# Patient Record
Sex: Female | Born: 1970 | Hispanic: No | Marital: Single | Smoking: Never smoker
Health system: Southern US, Community
[De-identification: ages and names within clinical notes are randomized; demographics above are authoritative.]

## PROBLEM LIST (undated history)

## (undated) DIAGNOSIS — F411 Generalized anxiety disorder: Secondary | ICD-10-CM

## (undated) DIAGNOSIS — D509 Iron deficiency anemia, unspecified: Secondary | ICD-10-CM

## (undated) DIAGNOSIS — F32A Depression, unspecified: Secondary | ICD-10-CM

## (undated) DIAGNOSIS — I10 Essential (primary) hypertension: Secondary | ICD-10-CM

## (undated) HISTORY — DX: Depression, unspecified: F32.A

## (undated) HISTORY — DX: Generalized anxiety disorder: F41.1

## (undated) HISTORY — DX: Iron deficiency anemia, unspecified: D50.9

---

## 2016-08-06 ENCOUNTER — Emergency Department (HOSPITAL_BASED_OUTPATIENT_CLINIC_OR_DEPARTMENT_OTHER)
Admission: EM | Admit: 2016-08-06 | Discharge: 2016-08-06 | Disposition: A | Discharge status: Home / Self Care | Payer: BLUE CROSS/BLUE SHIELD | Attending: Emergency Medicine | Admitting: Emergency Medicine

## 2016-08-06 ENCOUNTER — Encounter (HOSPITAL_BASED_OUTPATIENT_CLINIC_OR_DEPARTMENT_OTHER): Payer: Self-pay | Admitting: *Deleted

## 2016-08-06 ENCOUNTER — Emergency Department (HOSPITAL_BASED_OUTPATIENT_CLINIC_OR_DEPARTMENT_OTHER): Payer: BLUE CROSS/BLUE SHIELD

## 2016-08-06 DIAGNOSIS — Z9114 Patient's other noncompliance with medication regimen: Secondary | ICD-10-CM | POA: Diagnosis not present

## 2016-08-06 DIAGNOSIS — R6889 Other general symptoms and signs: Secondary | ICD-10-CM

## 2016-08-06 DIAGNOSIS — R0789 Other chest pain: Secondary | ICD-10-CM | POA: Insufficient documentation

## 2016-08-06 DIAGNOSIS — R51 Headache: Secondary | ICD-10-CM | POA: Diagnosis not present

## 2016-08-06 DIAGNOSIS — R05 Cough: Secondary | ICD-10-CM | POA: Insufficient documentation

## 2016-08-06 DIAGNOSIS — I10 Essential (primary) hypertension: Secondary | ICD-10-CM | POA: Diagnosis not present

## 2016-08-06 DIAGNOSIS — R0981 Nasal congestion: Secondary | ICD-10-CM | POA: Diagnosis not present

## 2016-08-06 DIAGNOSIS — R1111 Vomiting without nausea: Secondary | ICD-10-CM | POA: Diagnosis present

## 2016-08-06 HISTORY — DX: Essential (primary) hypertension: I10

## 2016-08-06 MED ORDER — KETOROLAC TROMETHAMINE 30 MG/ML IJ SOLN
60.0000 mg | Freq: Once | INTRAMUSCULAR | Status: AC
  Administered 2016-08-06: 60 mg via INTRAMUSCULAR
  Filled 2016-08-06: qty 2

## 2016-08-06 MED ORDER — HYDROCHLOROTHIAZIDE 12.5 MG PO TABS: 12.5000 mg | ORAL_TABLET | Freq: Every day | ORAL | 2 refills | Status: DC

## 2016-08-06 MED ORDER — ONDANSETRON 4 MG PO TBDP: 4.0000 mg | ORAL_TABLET | Freq: Four times a day (QID) | ORAL | 0 refills | Status: DC | PRN

## 2016-08-06 MED ORDER — ONDANSETRON 8 MG PO TBDP
8.0000 mg | ORAL_TABLET | Freq: Once | ORAL | Status: AC
  Administered 2016-08-06: 8 mg via ORAL
  Filled 2016-08-06: qty 1

## 2016-08-06 MED ORDER — LOSARTAN POTASSIUM 50 MG PO TABS: 50.0000 mg | ORAL_TABLET | Freq: Every day | ORAL | 2 refills | Status: DC

## 2016-08-06 MED ORDER — HYDROCHLOROTHIAZIDE 25 MG PO TABS
25.0000 mg | ORAL_TABLET | Freq: Once | ORAL | Status: AC
  Administered 2016-08-06: 25 mg via ORAL
  Filled 2016-08-06: qty 1

## 2016-08-06 MED ORDER — HYDROCHLOROTHIAZIDE 25 MG PO TABS
12.5000 mg | ORAL_TABLET | Freq: Once | ORAL | Status: DC
  Filled 2016-08-06: qty 1

## 2016-08-06 MED ORDER — LOSARTAN POTASSIUM 50 MG PO TABS
50.0000 mg | ORAL_TABLET | Freq: Once | ORAL | Status: DC
  Filled 2016-08-06: qty 1

## 2016-08-06 NOTE — Discharge Instructions (Addendum)
You may alternate Tylenol 1000 mg every 6 hours as needed for fever and pain and ibuprofen 800 mg every 8 hours as needed for fever and pain. Please rest and drink plenty of fluids. This is a viral illness causing your symptoms. You do not need antibiotics for a virus. You may use over-the-counter nasal saline spray and Afrin nasal saline spray as needed for nasal congestion. Please do not use Afrin for more than 3 days in a row. You may use Mucinex as needed for cough.  This may take 7-14 days to run its course.  We do not test for the flu from the emergency department as we do not have rapid flu swabs and it takes hours for this test to come back and it would not change our management. The flu is treated like any other virus with supportive measures as listed above.   Please start taking your blood pressure medication every day. I recommend you establish care with a primary care physician.    To find a primary care or specialty doctor please call [phone] or [phone] to access "[facility] Find a Doctor Service."  You may also go on the [facility] website at [email or web address]  There are also multiple [facility], [facility], [facility] and [facility] practices throughout the [place] that are frequently accepting new patients. You may find a clinic that is close to your home and contact them.  [facility] -  [address] [place] [phone]   [facility] -  [address] [place] [id number] [phone]   [facility] [phone]  [place] [phone]

## 2016-08-06 NOTE — ED Provider Notes (Signed)
TIME SEEN: 5:04 AM  CHIEF COMPLAINT: Flulike symptoms  HPI: Patient is a 46 year old female with history of hypertension not currently on medications who presents emergency department with several days of symptoms of gradual onset, mild, diffuse throbbing headache, cough with sputum production, nasal congestion, shortness of breath, chest soreness with coughing that is worse with palpation of her chest, body aches, nausea and vomiting. No diarrhea. Children with recent similar symptoms. No rash. No travel outside the country. States she is only taking Mucinex for her symptoms. No Tylenol or ibuprofen.  ROS: See HPI Constitutional: no fever  Eyes: no drainage  ENT: no runny nose   Cardiovascular:   chest pain  Resp: SOB  GI: vomiting GU: no dysuria Integumentary: no rash  Allergy: no hives  Musculoskeletal: no leg swelling  Neurological: no slurred speech ROS otherwise negative  PAST MEDICAL HISTORY/PAST SURGICAL HISTORY:  Past Medical History:  Diagnosis Date  . Hypertension     MEDICATIONS:  Prior to Admission medications   Not on File    ALLERGIES:  No Known Allergies  SOCIAL HISTORY:  Social History  Substance Use Topics  . Smoking status: Not on file  . Smokeless tobacco: Not on file  . Alcohol use Not on file    FAMILY HISTORY: No family history on file.  EXAM: BP (!) 146/129 (BP Location: Right Arm)   Pulse 99   Temp 98.9 F (37.2 C) (Oral)   Resp 18   Ht 5\' 6"  (1.676 m)   Wt 98.5 kg (217 lb 3.2 oz)   LMP 07/17/2016 (Exact Date)   SpO2 100%   BMI 35.06 kg/m  CONSTITUTIONAL: Alert and oriented and responds appropriately to questions. Well-appearing; well-nourished, Afebrile, nontoxic, obese HEAD: Normocephalic EYES: Conjunctivae clear, pupils appear equal, EOMI ENT: normal nose; moist mucous membranes; No pharyngeal erythema or petechiae, no tonsillar hypertrophy or exudate, no uvular deviation, no unilateral swelling, no trismus or drooling, no  muffled voice, normal phonation, no stridor, no dental caries present, no drainable dental abscess noted, no Ludwig's angina, tongue sits flat in the bottom of the mouth, no angioedema, no facial erythema or warmth, no facial swelling; no pain with movement of the neck. NECK: Supple, no meningismus, no nuchal rigidity, no LAD  CARD: RRR; S1 and S2 appreciated; no murmurs, no clicks, no rubs, no gallops CHEST:  Chest wall is tender to palpation diffusely throughout the anterior chest.  No crepitus, ecchymosis, erythema, warmth, rash or other lesions present.   RESP: Normal chest excursion without splinting or tachypnea; breath sounds clear and equal bilaterally; no wheezes, no rhonchi, no rales, no hypoxia or respiratory distress, speaking full sentences ABD/GI: Normal bowel sounds; non-distended; soft, non-tender, no rebound, no guarding, no peritoneal signs, no hepatosplenomegaly BACK:  The back appears normal and is non-tender to palpation, there is no CVA tenderness EXT: Normal ROM in all joints; non-tender to palpation; no edema; normal capillary refill; no cyanosis, no calf tenderness or swelling    SKIN: Normal color for age and race; warm; no rash NEURO: Moves all extremities equally, normal gait, normal speech, cranial nerves II through XII intact, sensation to light touch intact diffusely PSYCH: The patient's mood and manner are appropriate. Grooming and personal hygiene are appropriate.  MEDICAL DECISION MAKING: Patient here with flulike symptoms. She is afebrile here and has not any antipyretics. Complains of chest soreness with coughing and feeling short of breath but her lungs are clear. She is hypertensive and reports this is her baseline. States  she is not on medications for her blood pressure. Will obtain EKG and chest x-ray but I have low suspicion that this is ACS, dissection or PE. No signs of meningitis. No signs of pharyngitis. Abdominal exam is benign. She does not appear  dehydrated. Will treat symptomatically with IM Toradol, Zofran and reassess.  It appears patient is supposed to be on losartan 50 mg and HCTZ 12.5 mg but states she has not been taking this in several months. We'll give her a dose of her home medications and refills for the same. I have had lengthy discussion with her that she needs to be compliant with this medication as having high blood pressure can be very dangerous. I do not think that this is the cause of her current headache, chest pain. She has no focal neurologic deficits.  ED PROGRESS: 5:30 AM  Pt's blood pressure is improved to 139/87 before any medications have been given. This makes me suspect that her headache and chest pain are definitely not caused by her blood pressure.  EKG is reassuring.   6:05 AM  Pt's blood pressure is currently 126/73 with a heart rate in the 70s. She reports feeling better. She's been able to drink without vomiting. I feel she is safe to be discharged home. Her chest x-ray is clear. I do not feel she needs antibiotics at this time. Suspect viral illness. I have refilled her blood pressure medication. Will also give list of primary care physicians. Discussed supportive care instructions and return precautions.   At this time, I do not feel there is any life-threatening condition present. I have reviewed and discussed all results (EKG, imaging, lab, urine as appropriate) and exam findings with patient/family. I have reviewed nursing notes and appropriate previous records.  I feel the patient is safe to be discharged home without further emergent workup and can continue workup as an outpatient as needed. Discussed usual and customary return precautions. Patient/family verbalize understanding and are comfortable with this plan.  Outpatient follow-up has been provided if needed. All questions have been answered.     EKG Interpretation  Date/Time:  Wednesday Aug 06 2016 05:14:54 EDT Ventricular Rate:  91 PR  Interval:    QRS Duration: 81 QT Interval:  362 QTC Calculation: 446 R Axis:   18 Text Interpretation:  Sinus rhythm Borderline T wave abnormalities No old tracing to compare Confirmed by Ward, Baxter HireKristen [phone]([id number]) on 08/06/2016 5:20:28 AM          Ward, Layla MawKristen N, DO 08/06/16 [phone]

## 2016-08-06 NOTE — ED Triage Notes (Signed)
C/o ha, congestion, cough, light headed, sob generalized body aches and nausea and  Vomiting onset yesterday

## 2016-10-27 HISTORY — PX: BREAST BIOPSY: SHX20

## 2017-01-28 ENCOUNTER — Emergency Department (HOSPITAL_BASED_OUTPATIENT_CLINIC_OR_DEPARTMENT_OTHER): Payer: BLUE CROSS/BLUE SHIELD

## 2017-01-28 ENCOUNTER — Emergency Department (HOSPITAL_BASED_OUTPATIENT_CLINIC_OR_DEPARTMENT_OTHER)
Admission: EM | Admit: 2017-01-28 | Discharge: 2017-01-28 | Disposition: A | Discharge status: Home / Self Care | Payer: BLUE CROSS/BLUE SHIELD | Attending: Emergency Medicine | Admitting: Emergency Medicine

## 2017-01-28 ENCOUNTER — Other Ambulatory Visit: Payer: Self-pay

## 2017-01-28 ENCOUNTER — Encounter (HOSPITAL_BASED_OUTPATIENT_CLINIC_OR_DEPARTMENT_OTHER): Payer: Self-pay

## 2017-01-28 DIAGNOSIS — Z79899 Other long term (current) drug therapy: Secondary | ICD-10-CM | POA: Diagnosis not present

## 2017-01-28 DIAGNOSIS — I10 Essential (primary) hypertension: Secondary | ICD-10-CM | POA: Diagnosis not present

## 2017-01-28 DIAGNOSIS — G43109 Migraine with aura, not intractable, without status migrainosus: Secondary | ICD-10-CM | POA: Diagnosis not present

## 2017-01-28 DIAGNOSIS — R51 Headache: Secondary | ICD-10-CM | POA: Diagnosis present

## 2017-01-28 LAB — COMPREHENSIVE METABOLIC PANEL
ALBUMIN: 4 g/dL (ref 3.5–5.0)
ALT: 19 U/L (ref 14–54)
AST: 21 U/L (ref 15–41)
Alkaline Phosphatase: 65 U/L (ref 38–126)
Anion gap: 6 (ref 5–15)
BILIRUBIN TOTAL: 0.4 mg/dL (ref 0.3–1.2)
BUN: 16 mg/dL (ref 6–20)
CALCIUM: 9.3 mg/dL (ref 8.9–10.3)
CHLORIDE: 104 mmol/L (ref 101–111)
CO2: 28 mmol/L (ref 22–32)
Creatinine, Ser: 1.02 mg/dL — ABNORMAL HIGH (ref 0.44–1.00)
GFR calc Af Amer: >60 mL/min (ref >60)
GFR calc non Af Amer: >60 mL/min (ref >60)
GLUCOSE: 96 mg/dL (ref 65–99)
POTASSIUM: 3.6 mmol/L (ref 3.5–5.1)
SODIUM: 138 mmol/L (ref 135–145)
TOTAL PROTEIN: 7.2 g/dL (ref 6.5–8.1)

## 2017-01-28 LAB — RAPID URINE DRUG SCREEN, HOSP PERFORMED
Amphetamines: NOT DETECTED
Barbiturates: NOT DETECTED
Benzodiazepines: NOT DETECTED
COCAINE: NOT DETECTED
OPIATES: NOT DETECTED
TETRAHYDROCANNABINOL: NOT DETECTED

## 2017-01-28 LAB — CBC
HCT: 39.1 % (ref 36.0–46.0)
HEMOGLOBIN: 12.7 g/dL (ref 12.0–15.0)
MCH: 26 pg (ref 26.0–34.0)
MCHC: 32.5 g/dL (ref 30.0–36.0)
MCV: 80.1 fL (ref 78.0–100.0)
Platelets: 418 10*3/uL — ABNORMAL HIGH (ref 150–400)
RBC: 4.88 MIL/uL (ref 3.87–5.11)
RDW: 15.1 % (ref 11.5–15.5)
WBC: 8.3 10*3/uL (ref 4.0–10.5)

## 2017-01-28 LAB — DIFFERENTIAL
BASOS ABS: 0 10*3/uL (ref 0.0–0.1)
Basophils Relative: 0 %
EOS ABS: 0.1 10*3/uL (ref 0.0–0.7)
Eosinophils Relative: 2 %
LYMPHS ABS: 3.4 10*3/uL (ref 0.7–4.0)
Lymphocytes Relative: 41 %
Monocytes Absolute: 0.7 10*3/uL (ref 0.1–1.0)
Monocytes Relative: 9 %
NEUTROS PCT: 48 %
Neutro Abs: 4 10*3/uL (ref 1.7–7.7)

## 2017-01-28 LAB — URINALYSIS, ROUTINE W REFLEX MICROSCOPIC
Bilirubin Urine: NEGATIVE
Glucose, UA: NEGATIVE mg/dL
Hgb urine dipstick: NEGATIVE
Ketones, ur: NEGATIVE mg/dL
LEUKOCYTES UA: NEGATIVE
Nitrite: NEGATIVE
PH: 5.5 (ref 5.0–8.0)
PROTEIN: NEGATIVE mg/dL

## 2017-01-28 LAB — TROPONIN I

## 2017-01-28 LAB — PREGNANCY, URINE: PREG TEST UR: NEGATIVE

## 2017-01-28 LAB — ETHANOL

## 2017-01-28 MED ORDER — SODIUM CHLORIDE 0.9 % IV BOLUS (SEPSIS)
1000.0000 mL | Freq: Once | INTRAVENOUS | Status: AC
  Administered 2017-01-28: 1000 mL via INTRAVENOUS

## 2017-01-28 MED ORDER — IOPAMIDOL (ISOVUE-370) INJECTION 76%
100.0000 mL | Freq: Once | INTRAVENOUS | Status: AC | PRN
  Administered 2017-01-28: 80 mL via INTRAVENOUS

## 2017-01-28 MED ORDER — DIPHENHYDRAMINE HCL 50 MG/ML IJ SOLN
25.0000 mg | Freq: Once | INTRAMUSCULAR | Status: AC
  Administered 2017-01-28: 25 mg via INTRAVENOUS
  Filled 2017-01-28: qty 1

## 2017-01-28 MED ORDER — METOCLOPRAMIDE HCL 5 MG/ML IJ SOLN
10.0000 mg | Freq: Once | INTRAMUSCULAR | Status: AC
  Administered 2017-01-28: 10 mg via INTRAVENOUS
  Filled 2017-01-28: qty 2

## 2017-01-28 NOTE — ED Notes (Signed)
MD at bedside, discussing results with patient at this time.

## 2017-01-28 NOTE — ED Provider Notes (Signed)
[facility] EMERGENCY DEPARTMENT Provider Note   CSN: [id number] Arrival date & time: 01/28/17  0940     History   Chief Complaint Chief Complaint  Patient presents with  . Headache  . Hypertension    HPI Cheryl Wiggins is a 46 y.o. female history of hypertension with medication noncompliance here presenting with headache, weakness, hypertension.  Patient has a history of hypertension but has not been taking her blood pressure medicines for the last several months.  For the last 6 days, patient has been having headaches.  She has been checking her blood pressure and was elevated around 160.  She went to see [facility] medical urgent care 4 days ago and was thought to have symptomatic hypertension with migraines and was given shots for pain as well as prescribed losartan/hydrochlorothiazide.  She had a follow-up at the urgent care today for blood pressure recheck and apparently told them that she has intermittent right-sided weakness.  She states that she has been having right-sided weakness for the last 4-5 days that has not been getting worse.  She is right-hand dominant and is not dropping anything and is able to type well.  She denies any numbness to the right arm or leg and has been able to walk and denies any trouble with her speech.  Patient denies any history of strokes in the past and denies any neck pain.  Patient was sent here from urgent care for possible stroke vs complex migraines.   The history is provided by the patient.    Past Medical History:  Diagnosis Date  . Hypertension     There are no active problems to display for this patient.   History reviewed. No pertinent surgical history.  OB History    No data available       Home Medications    Prior to Admission medications   Medication Sig Start Date End Date Taking? Authorizing Provider  hydrochlorothiazide (HYDRODIURIL) 12.5 MG tablet Take 1 tablet (12.5 mg total) by mouth daily. 08/06/16   Ward,  Layla Maw, DO  losartan (COZAAR) 50 MG tablet Take 1 tablet (50 mg total) by mouth daily. 08/06/16   Ward, Layla Maw, DO  ondansetron (ZOFRAN ODT) 4 MG disintegrating tablet Take 1 tablet (4 mg total) by mouth every 6 (six) hours as needed for nausea or vomiting. 08/06/16   Ward, Layla Maw, DO    Family History No family history on file.  Social History Social History   Tobacco Use  . Smoking status: Never Smoker  . Smokeless tobacco: Never Used  Substance Use Topics  . Alcohol use: No    Frequency: Never  . Drug use: Not on file     Allergies   Patient has no known allergies.   Review of Systems Review of Systems  Neurological: Positive for headaches.  All other systems reviewed and are negative.    Physical Exam Updated Vital Signs BP 120/83   Pulse 63   Temp 98 F (36.7 C)   Resp 16   Ht 5\' 6"  (1.676 m)   Wt 99.8 kg (220 lb)   LMP 01/14/2017   SpO2 100%   BMI 35.51 kg/m   Physical Exam  Constitutional: She appears well-developed.  HENT:  Head: Normocephalic.  Mouth/Throat: Oropharynx is clear and moist.  Eyes: EOM are normal. Pupils are equal, round, and reactive to light.  Neck: Normal range of motion. Neck supple.  No obvious bruit  Cardiovascular: Normal rate and normal heart sounds.  Pulmonary/Chest: Effort normal. No respiratory distress.  Abdominal: Soft. Bowel sounds are normal.  Musculoskeletal: Normal range of motion.  Neurological: She is alert. GCS eye subscore is 4. GCS verbal subscore is 5. GCS motor subscore is 6.  Strength 4/5 R arm and leg, 5/5 L arm/leg. Nl finger to nose bilaterally, nl strength throughout, nl gait. CN 2-12 intact   Skin: Skin is warm.  Psychiatric: She has a normal mood and affect.  Nursing note and vitals reviewed.    ED Treatments / Results  Labs (all labs ordered are listed, but only abnormal results are displayed) Labs Reviewed  CBC - Abnormal; Notable for the following components:      Result Value    Platelets 418 (*)    All other components within normal limits  COMPREHENSIVE METABOLIC PANEL - Abnormal; Notable for the following components:   Creatinine, Ser 1.02 (*)    All other components within normal limits  ETHANOL  DIFFERENTIAL  TROPONIN I  RAPID URINE DRUG SCREEN, HOSP PERFORMED  URINALYSIS, ROUTINE W REFLEX MICROSCOPIC  PREGNANCY, URINE    EKG  EKG Interpretation  Date/Time:  Wednesday January 28 2017 09:54:08 EST Ventricular Rate:  61 PR Interval:    QRS Duration: 106 QT Interval:  438 QTC Calculation: 442 R Axis:   25 Text Interpretation:  Sinus rhythm Low voltage, precordial leads Baseline wander in lead(s) V6 No significant change since last tracing Confirmed by Richardean CanalYao, Aryani Daffern H [phone]([id number]) on 01/28/2017 10:00:51 AM       Radiology Ct Angio Head W Or Wo Contrast  Result Date: 01/28/2017 CLINICAL DATA:  Dizziness. Hypertension. Subarachnoid hemorrhage suspected. EXAM: CT ANGIOGRAPHY HEAD AND NECK TECHNIQUE: Multidetector CT imaging of the head and neck was performed using the standard protocol during bolus administration of intravenous contrast. Multiplanar CT image reconstructions and MIPs were obtained to evaluate the vascular anatomy. Carotid stenosis measurements (when applicable) are obtained utilizing NASCET criteria, using the distal internal carotid diameter as the denominator. CONTRAST:  80mL ISOVUE-370 IOPAMIDOL (ISOVUE-370) INJECTION 76% COMPARISON:  None. FINDINGS: CT HEAD FINDINGS Brain: No acute infarct, hemorrhage, or mass lesion is present. The ventricles are of normal size. No significant extraaxial fluid collection is present. The brainstem and cerebellum are normal. Vascular: No hyperdense vessel or unexpected calcification. Skull: Calvarium is intact. No focal lytic or blastic lesions are present. Sinuses: The paranasal sinuses and mastoid air cells are clear. Orbits: The globes and orbits are within normal limits. Review of the MIP images confirms the  above findings CTA NECK FINDINGS Aortic arch: The a 3 vessel arch configuration is present. No focal stenosis is present. Right carotid system: The right common carotid artery is within normal limits. The bifurcation is unremarkable. The cervical right ICA is normal. Left carotid system: The left common carotid artery is within normal limits. The bifurcation is unremarkable. Cervical left ICA is normal. Vertebral arteries: The vertebral artery is are within normal limits bilaterally. Both vertebral arteries originate from the subclavian arteries without focal stenosis. The left vertebral artery is slightly dominant to the right. There is some tortuosity in the neck without focal stenosis. Skeleton: There is straightening and some reversal of the normal cervical lordosis. This is likely positional. No focal lytic or blastic lesions are present. No focal stenosis is evident. Other neck: Soft tissues of the neck are otherwise unremarkable. Upper chest: The lung apices are clear. Review of the MIP images confirms the above findings CTA HEAD FINDINGS Anterior circulation: Internal carotid artery is are  within normal limits through the ICA termini bilaterally. A1 and M1 segments are normal. Anterior communicating artery is patent. There is no aneurysm. ACA and MCA branch vessels are within normal limits bilaterally. MCA bifurcations are within normal limits. Posterior circulation: The right vertebral artery bifurcates at the PICA. The left PICA origin is visualized and normal. Vertebrobasilar junction is normal. The basilar artery is small. The left posterior cerebral artery originates from basilar tip. The right posterior cerebral artery is of fetal type. PCA branch vessels are within normal limits. Venous sinuses: Dural sinuses are patent. The transverse sinuses codominant. Straight sinus deep cerebral veins are intact. Anatomic variants: Fetal type right posterior cerebral artery. Delayed phase: Postcontrast images  demonstrate no pathologic enhancement. Review of the MIP images confirms the above findings IMPRESSION: 1. Negative CT of the head and neck. 2. No subarachnoid hemorrhage or aneurysm. 3. Fetal type right posterior cerebral artery, a normal variant. Electronically Signed   By: Marin Roberts M.D.   On: 01/28/2017 11:33   Ct Angio Neck W And/or Wo Contrast  Result Date: 01/28/2017 CLINICAL DATA:  Dizziness. Hypertension. Subarachnoid hemorrhage suspected. EXAM: CT ANGIOGRAPHY HEAD AND NECK TECHNIQUE: Multidetector CT imaging of the head and neck was performed using the standard protocol during bolus administration of intravenous contrast. Multiplanar CT image reconstructions and MIPs were obtained to evaluate the vascular anatomy. Carotid stenosis measurements (when applicable) are obtained utilizing NASCET criteria, using the distal internal carotid diameter as the denominator. CONTRAST:  80mL ISOVUE-370 IOPAMIDOL (ISOVUE-370) INJECTION 76% COMPARISON:  None. FINDINGS: CT HEAD FINDINGS Brain: No acute infarct, hemorrhage, or mass lesion is present. The ventricles are of normal size. No significant extraaxial fluid collection is present. The brainstem and cerebellum are normal. Vascular: No hyperdense vessel or unexpected calcification. Skull: Calvarium is intact. No focal lytic or blastic lesions are present. Sinuses: The paranasal sinuses and mastoid air cells are clear. Orbits: The globes and orbits are within normal limits. Review of the MIP images confirms the above findings CTA NECK FINDINGS Aortic arch: The a 3 vessel arch configuration is present. No focal stenosis is present. Right carotid system: The right common carotid artery is within normal limits. The bifurcation is unremarkable. The cervical right ICA is normal. Left carotid system: The left common carotid artery is within normal limits. The bifurcation is unremarkable. Cervical left ICA is normal. Vertebral arteries: The vertebral artery is  are within normal limits bilaterally. Both vertebral arteries originate from the subclavian arteries without focal stenosis. The left vertebral artery is slightly dominant to the right. There is some tortuosity in the neck without focal stenosis. Skeleton: There is straightening and some reversal of the normal cervical lordosis. This is likely positional. No focal lytic or blastic lesions are present. No focal stenosis is evident. Other neck: Soft tissues of the neck are otherwise unremarkable. Upper chest: The lung apices are clear. Review of the MIP images confirms the above findings CTA HEAD FINDINGS Anterior circulation: Internal carotid artery is are within normal limits through the ICA termini bilaterally. A1 and M1 segments are normal. Anterior communicating artery is patent. There is no aneurysm. ACA and MCA branch vessels are within normal limits bilaterally. MCA bifurcations are within normal limits. Posterior circulation: The right vertebral artery bifurcates at the PICA. The left PICA origin is visualized and normal. Vertebrobasilar junction is normal. The basilar artery is small. The left posterior cerebral artery originates from basilar tip. The right posterior cerebral artery is of fetal type. PCA branch vessels  are within normal limits. Venous sinuses: Dural sinuses are patent. The transverse sinuses codominant. Straight sinus deep cerebral veins are intact. Anatomic variants: Fetal type right posterior cerebral artery. Delayed phase: Postcontrast images demonstrate no pathologic enhancement. Review of the MIP images confirms the above findings IMPRESSION: 1. Negative CT of the head and neck. 2. No subarachnoid hemorrhage or aneurysm. 3. Fetal type right posterior cerebral artery, a normal variant. Electronically Signed   By: Marin Robertshristopher  Mattern M.D.   On: 01/28/2017 11:33    Procedures Procedures (including critical care time)  Medications Ordered in ED Medications  metoCLOPramide (REGLAN)  injection 10 mg (10 mg Intravenous Given 01/28/17 1001)  diphenhydrAMINE (BENADRYL) injection 25 mg (25 mg Intravenous Given 01/28/17 1001)  sodium chloride 0.9 % bolus 1,000 mL (0 mLs Intravenous Stopped 01/28/17 1135)  iopamidol (ISOVUE-370) 76 % injection 100 mL (80 mLs Intravenous Contrast Given 01/28/17 1030)     Initial Impression / Assessment and Plan / ED Course  I have reviewed the triage vital signs and the nursing notes.  Pertinent labs & imaging results that were available during my care of the patient were reviewed by me and considered in my medical decision making (see chart for details).     Cheryl Wiggins is a [age] y.o. female here with headache, weakness for 4 days. Likely complex migraines vs symptomatic hypertension. No speech deficits and only has mild R sided weakness that appears somewhat subjective. Not a TPA candidate. Will get CTA head/neck, labs. Will give migraine cocktail and reassess.    11:45 AM CT head/neck unremarkable. Headache resolved. Repeat strength normal now. BP normal now. I think likely complex migraine vs symptomatic hypertension. Recommend taking BP meds as prescribed, tylenol, motrin for headaches.   Final Clinical Impressions(s) / ED Diagnoses   Final diagnoses:  None    ED Discharge Orders    None       Charlynne PanderYao, Jamilia Jacques Hsienta, MD 01/28/17 1147

## 2017-01-28 NOTE — ED Triage Notes (Signed)
Pt reports hypertension and headache on Sunday. Recent changes of medication. Pt reports being off BP meds x "months." Pt reports headache and dizziness that started Thursday or Friday.  Pt went to Urgent Care on Sunday. Given HA meds and HTN medications. Pt went to follow up today at PCP. Pt sts "weak" on right side. Pt reports HA and nausea at this time. Also sts throbbing headache went away but it is still dull.

## 2017-01-28 NOTE — Discharge Instructions (Signed)
You need to take your blood pressure meds as prescribed.   Follow up with your doctor in a week to recheck blood pressure.   Take tylenol, motrin for headaches.   Return to ER if you have worse headaches, vomiting, weakness, numbness, trouble speaking

## 2017-04-08 LAB — RESULTS CONSOLE HPV: CHL HPV: NEGATIVE

## 2017-04-08 LAB — HM PAP SMEAR

## 2019-03-02 ENCOUNTER — Other Ambulatory Visit: Payer: Self-pay | Admitting: Obstetrics & Gynecology

## 2019-03-02 DIAGNOSIS — N644 Mastodynia: Secondary | ICD-10-CM

## 2019-03-18 ENCOUNTER — Other Ambulatory Visit: Payer: BLUE CROSS/BLUE SHIELD

## 2019-04-01 ENCOUNTER — Ambulatory Visit
Admission: RE | Admit: 2019-04-01 | Discharge: 2019-04-01 | Disposition: A | Discharge status: Home / Self Care | Payer: BC Managed Care – PPO | Source: Ambulatory Visit | Attending: Obstetrics & Gynecology | Admitting: Obstetrics & Gynecology

## 2019-04-01 ENCOUNTER — Other Ambulatory Visit: Payer: Self-pay

## 2019-04-01 DIAGNOSIS — N644 Mastodynia: Secondary | ICD-10-CM

## 2020-08-09 ENCOUNTER — Other Ambulatory Visit: Payer: Self-pay | Admitting: Obstetrics and Gynecology

## 2020-08-09 DIAGNOSIS — Z1621 Resistance to vancomycin: Secondary | ICD-10-CM

## 2020-08-09 DIAGNOSIS — B957 Other staphylococcus as the cause of diseases classified elsewhere: Secondary | ICD-10-CM

## 2020-08-11 ENCOUNTER — Ambulatory Visit
Admission: RE | Admit: 2020-08-11 | Discharge: 2020-08-11 | Disposition: A | Discharge status: Home / Self Care | Payer: BC Managed Care – PPO | Source: Ambulatory Visit | Attending: Obstetrics and Gynecology | Admitting: Obstetrics and Gynecology

## 2020-08-11 ENCOUNTER — Other Ambulatory Visit: Payer: Self-pay

## 2020-08-11 DIAGNOSIS — B957 Other staphylococcus as the cause of diseases classified elsewhere: Secondary | ICD-10-CM

## 2020-08-11 DIAGNOSIS — Z1621 Resistance to vancomycin: Secondary | ICD-10-CM

## 2021-01-24 DIAGNOSIS — F411 Generalized anxiety disorder: Secondary | ICD-10-CM | POA: Insufficient documentation

## 2021-01-24 DIAGNOSIS — F333 Major depressive disorder, recurrent, severe with psychotic symptoms: Secondary | ICD-10-CM | POA: Insufficient documentation

## 2021-07-09 DIAGNOSIS — F333 Major depressive disorder, recurrent, severe with psychotic symptoms: Secondary | ICD-10-CM | POA: Diagnosis not present

## 2021-07-09 DIAGNOSIS — F411 Generalized anxiety disorder: Secondary | ICD-10-CM | POA: Diagnosis not present

## 2021-07-11 DIAGNOSIS — Z0000 Encounter for general adult medical examination without abnormal findings: Secondary | ICD-10-CM | POA: Diagnosis not present

## 2021-07-11 DIAGNOSIS — E785 Hyperlipidemia, unspecified: Secondary | ICD-10-CM | POA: Diagnosis not present

## 2021-07-11 DIAGNOSIS — E559 Vitamin D deficiency, unspecified: Secondary | ICD-10-CM | POA: Diagnosis not present

## 2021-07-11 DIAGNOSIS — Z79899 Other long term (current) drug therapy: Secondary | ICD-10-CM | POA: Diagnosis not present

## 2021-07-11 DIAGNOSIS — Z1339 Encounter for screening examination for other mental health and behavioral disorders: Secondary | ICD-10-CM | POA: Diagnosis not present

## 2021-07-11 DIAGNOSIS — Z114 Encounter for screening for human immunodeficiency virus [HIV]: Secondary | ICD-10-CM | POA: Diagnosis not present

## 2021-07-11 DIAGNOSIS — Z131 Encounter for screening for diabetes mellitus: Secondary | ICD-10-CM | POA: Diagnosis not present

## 2021-07-11 DIAGNOSIS — I10 Essential (primary) hypertension: Secondary | ICD-10-CM | POA: Diagnosis not present

## 2021-07-11 DIAGNOSIS — Z6831 Body mass index (BMI) 31.0-31.9, adult: Secondary | ICD-10-CM | POA: Diagnosis not present

## 2021-07-11 DIAGNOSIS — R5383 Other fatigue: Secondary | ICD-10-CM | POA: Diagnosis not present

## 2021-07-11 DIAGNOSIS — R030 Elevated blood-pressure reading, without diagnosis of hypertension: Secondary | ICD-10-CM | POA: Diagnosis not present

## 2021-07-15 DIAGNOSIS — Z79899 Other long term (current) drug therapy: Secondary | ICD-10-CM | POA: Diagnosis not present

## 2021-09-05 DIAGNOSIS — Z113 Encounter for screening for infections with a predominantly sexual mode of transmission: Secondary | ICD-10-CM | POA: Diagnosis not present

## 2021-09-05 DIAGNOSIS — N898 Other specified noninflammatory disorders of vagina: Secondary | ICD-10-CM | POA: Diagnosis not present

## 2021-09-05 DIAGNOSIS — Z202 Contact with and (suspected) exposure to infections with a predominantly sexual mode of transmission: Secondary | ICD-10-CM | POA: Diagnosis not present

## 2021-09-13 DIAGNOSIS — F411 Generalized anxiety disorder: Secondary | ICD-10-CM | POA: Diagnosis not present

## 2021-09-13 DIAGNOSIS — F333 Major depressive disorder, recurrent, severe with psychotic symptoms: Secondary | ICD-10-CM | POA: Diagnosis not present

## 2021-10-22 DIAGNOSIS — E785 Hyperlipidemia, unspecified: Secondary | ICD-10-CM | POA: Diagnosis not present

## 2021-10-22 DIAGNOSIS — Z79899 Other long term (current) drug therapy: Secondary | ICD-10-CM | POA: Diagnosis not present

## 2021-10-22 DIAGNOSIS — R110 Nausea: Secondary | ICD-10-CM | POA: Diagnosis not present

## 2021-10-22 DIAGNOSIS — F32A Depression, unspecified: Secondary | ICD-10-CM | POA: Diagnosis not present

## 2021-10-22 DIAGNOSIS — I10 Essential (primary) hypertension: Secondary | ICD-10-CM | POA: Diagnosis not present

## 2021-10-22 DIAGNOSIS — Z6830 Body mass index (BMI) 30.0-30.9, adult: Secondary | ICD-10-CM | POA: Diagnosis not present

## 2021-10-24 DIAGNOSIS — Z79899 Other long term (current) drug therapy: Secondary | ICD-10-CM | POA: Diagnosis not present

## 2022-01-23 DIAGNOSIS — N1831 Chronic kidney disease, stage 3a: Secondary | ICD-10-CM | POA: Diagnosis not present

## 2022-01-23 DIAGNOSIS — R7309 Other abnormal glucose: Secondary | ICD-10-CM | POA: Diagnosis not present

## 2022-01-23 DIAGNOSIS — Z79899 Other long term (current) drug therapy: Secondary | ICD-10-CM | POA: Diagnosis not present

## 2022-01-23 DIAGNOSIS — N898 Other specified noninflammatory disorders of vagina: Secondary | ICD-10-CM | POA: Diagnosis not present

## 2022-01-23 DIAGNOSIS — F419 Anxiety disorder, unspecified: Secondary | ICD-10-CM | POA: Diagnosis not present

## 2022-01-23 DIAGNOSIS — I10 Essential (primary) hypertension: Secondary | ICD-10-CM | POA: Diagnosis not present

## 2022-01-23 DIAGNOSIS — E785 Hyperlipidemia, unspecified: Secondary | ICD-10-CM | POA: Diagnosis not present

## 2022-01-23 DIAGNOSIS — Z6831 Body mass index (BMI) 31.0-31.9, adult: Secondary | ICD-10-CM | POA: Diagnosis not present

## 2022-01-24 DIAGNOSIS — R3989 Other symptoms and signs involving the genitourinary system: Secondary | ICD-10-CM | POA: Diagnosis not present

## 2022-01-24 DIAGNOSIS — N898 Other specified noninflammatory disorders of vagina: Secondary | ICD-10-CM | POA: Diagnosis not present

## 2022-01-24 DIAGNOSIS — Z202 Contact with and (suspected) exposure to infections with a predominantly sexual mode of transmission: Secondary | ICD-10-CM | POA: Diagnosis not present

## 2022-01-26 DIAGNOSIS — Z79899 Other long term (current) drug therapy: Secondary | ICD-10-CM | POA: Diagnosis not present

## 2022-02-14 DIAGNOSIS — L700 Acne vulgaris: Secondary | ICD-10-CM | POA: Diagnosis not present

## 2022-02-14 DIAGNOSIS — L309 Dermatitis, unspecified: Secondary | ICD-10-CM | POA: Diagnosis not present

## 2022-02-14 DIAGNOSIS — L905 Scar conditions and fibrosis of skin: Secondary | ICD-10-CM | POA: Diagnosis not present

## 2022-02-14 DIAGNOSIS — L810 Postinflammatory hyperpigmentation: Secondary | ICD-10-CM | POA: Diagnosis not present

## 2022-02-21 DIAGNOSIS — J069 Acute upper respiratory infection, unspecified: Secondary | ICD-10-CM | POA: Diagnosis not present

## 2022-02-21 DIAGNOSIS — Z79899 Other long term (current) drug therapy: Secondary | ICD-10-CM | POA: Diagnosis not present

## 2022-02-21 DIAGNOSIS — Z6831 Body mass index (BMI) 31.0-31.9, adult: Secondary | ICD-10-CM | POA: Diagnosis not present

## 2022-02-21 DIAGNOSIS — E785 Hyperlipidemia, unspecified: Secondary | ICD-10-CM | POA: Diagnosis not present

## 2022-02-21 DIAGNOSIS — I10 Essential (primary) hypertension: Secondary | ICD-10-CM | POA: Diagnosis not present

## 2022-02-21 DIAGNOSIS — N1831 Chronic kidney disease, stage 3a: Secondary | ICD-10-CM | POA: Diagnosis not present

## 2022-02-21 DIAGNOSIS — R635 Abnormal weight gain: Secondary | ICD-10-CM | POA: Diagnosis not present

## 2022-02-25 DIAGNOSIS — Z79899 Other long term (current) drug therapy: Secondary | ICD-10-CM | POA: Diagnosis not present

## 2022-03-13 DIAGNOSIS — I10 Essential (primary) hypertension: Secondary | ICD-10-CM | POA: Diagnosis not present

## 2022-03-13 DIAGNOSIS — F419 Anxiety disorder, unspecified: Secondary | ICD-10-CM | POA: Diagnosis not present

## 2022-03-13 DIAGNOSIS — Z79899 Other long term (current) drug therapy: Secondary | ICD-10-CM | POA: Diagnosis not present

## 2022-03-13 DIAGNOSIS — Z6831 Body mass index (BMI) 31.0-31.9, adult: Secondary | ICD-10-CM | POA: Diagnosis not present

## 2022-03-13 DIAGNOSIS — R635 Abnormal weight gain: Secondary | ICD-10-CM | POA: Diagnosis not present

## 2022-03-13 DIAGNOSIS — E785 Hyperlipidemia, unspecified: Secondary | ICD-10-CM | POA: Diagnosis not present

## 2022-03-24 DIAGNOSIS — K219 Gastro-esophageal reflux disease without esophagitis: Secondary | ICD-10-CM | POA: Diagnosis not present

## 2022-03-24 DIAGNOSIS — Z6831 Body mass index (BMI) 31.0-31.9, adult: Secondary | ICD-10-CM | POA: Diagnosis not present

## 2022-03-24 DIAGNOSIS — R110 Nausea: Secondary | ICD-10-CM | POA: Diagnosis not present

## 2022-03-24 DIAGNOSIS — R635 Abnormal weight gain: Secondary | ICD-10-CM | POA: Diagnosis not present

## 2022-03-24 DIAGNOSIS — D509 Iron deficiency anemia, unspecified: Secondary | ICD-10-CM | POA: Diagnosis not present

## 2022-04-10 DIAGNOSIS — N898 Other specified noninflammatory disorders of vagina: Secondary | ICD-10-CM | POA: Diagnosis not present

## 2022-04-10 DIAGNOSIS — Z202 Contact with and (suspected) exposure to infections with a predominantly sexual mode of transmission: Secondary | ICD-10-CM | POA: Diagnosis not present

## 2022-04-10 DIAGNOSIS — Z30011 Encounter for initial prescription of contraceptive pills: Secondary | ICD-10-CM | POA: Diagnosis not present

## 2022-04-10 DIAGNOSIS — A599 Trichomoniasis, unspecified: Secondary | ICD-10-CM | POA: Diagnosis not present

## 2022-04-29 DIAGNOSIS — I10 Essential (primary) hypertension: Secondary | ICD-10-CM | POA: Diagnosis not present

## 2022-04-29 DIAGNOSIS — N951 Menopausal and female climacteric states: Secondary | ICD-10-CM | POA: Diagnosis not present

## 2022-04-29 DIAGNOSIS — E7800 Pure hypercholesterolemia, unspecified: Secondary | ICD-10-CM | POA: Diagnosis not present

## 2022-04-29 DIAGNOSIS — D539 Nutritional anemia, unspecified: Secondary | ICD-10-CM | POA: Diagnosis not present

## 2022-04-29 DIAGNOSIS — Z6833 Body mass index (BMI) 33.0-33.9, adult: Secondary | ICD-10-CM | POA: Diagnosis not present

## 2022-04-29 DIAGNOSIS — E559 Vitamin D deficiency, unspecified: Secondary | ICD-10-CM | POA: Diagnosis not present

## 2022-04-29 DIAGNOSIS — Z131 Encounter for screening for diabetes mellitus: Secondary | ICD-10-CM | POA: Diagnosis not present

## 2022-05-29 DIAGNOSIS — L700 Acne vulgaris: Secondary | ICD-10-CM | POA: Diagnosis not present

## 2022-05-29 DIAGNOSIS — L2084 Intrinsic (allergic) eczema: Secondary | ICD-10-CM | POA: Diagnosis not present

## 2022-05-29 DIAGNOSIS — L819 Disorder of pigmentation, unspecified: Secondary | ICD-10-CM | POA: Diagnosis not present

## 2022-07-23 DIAGNOSIS — N898 Other specified noninflammatory disorders of vagina: Secondary | ICD-10-CM | POA: Diagnosis not present

## 2022-07-23 DIAGNOSIS — Z113 Encounter for screening for infections with a predominantly sexual mode of transmission: Secondary | ICD-10-CM | POA: Diagnosis not present

## 2022-07-23 DIAGNOSIS — R102 Pelvic and perineal pain: Secondary | ICD-10-CM | POA: Diagnosis not present

## 2022-07-23 DIAGNOSIS — N939 Abnormal uterine and vaginal bleeding, unspecified: Secondary | ICD-10-CM | POA: Diagnosis not present

## 2022-07-30 DIAGNOSIS — D250 Submucous leiomyoma of uterus: Secondary | ICD-10-CM | POA: Diagnosis not present

## 2022-07-30 DIAGNOSIS — N939 Abnormal uterine and vaginal bleeding, unspecified: Secondary | ICD-10-CM | POA: Diagnosis not present

## 2022-07-30 DIAGNOSIS — R102 Pelvic and perineal pain: Secondary | ICD-10-CM | POA: Diagnosis not present

## 2022-07-31 IMAGING — MG MM DIGITAL SCREENING BILAT W/ TOMO AND CAD
8 series · 8 of 24 positions shown · non-contrast
Comparison: Previous exam(s).

CLINICAL DATA: Screening.

EXAM:
DIGITAL SCREENING BILATERAL MAMMOGRAM WITH TOMOSYNTHESIS AND CAD
TECHNIQUE: Bilateral screening digital craniocaudal and mediolateral oblique
mammograms were obtained. Bilateral screening digital breast
tomosynthesis was performed. The images were evaluated with
computer-aided detection.

[L MLO synth-2D]
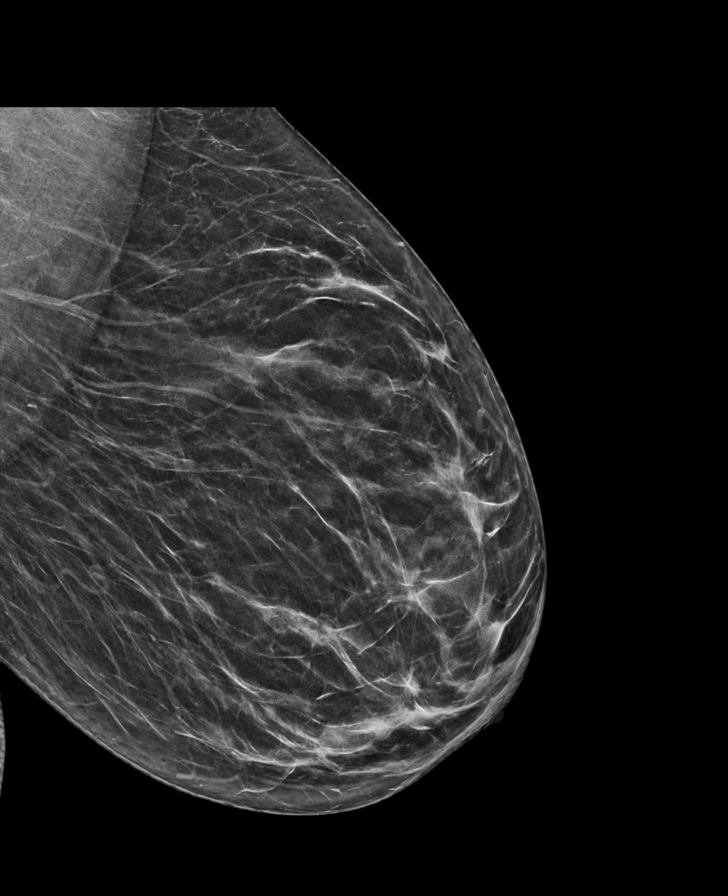

[R MLO synth-2D]
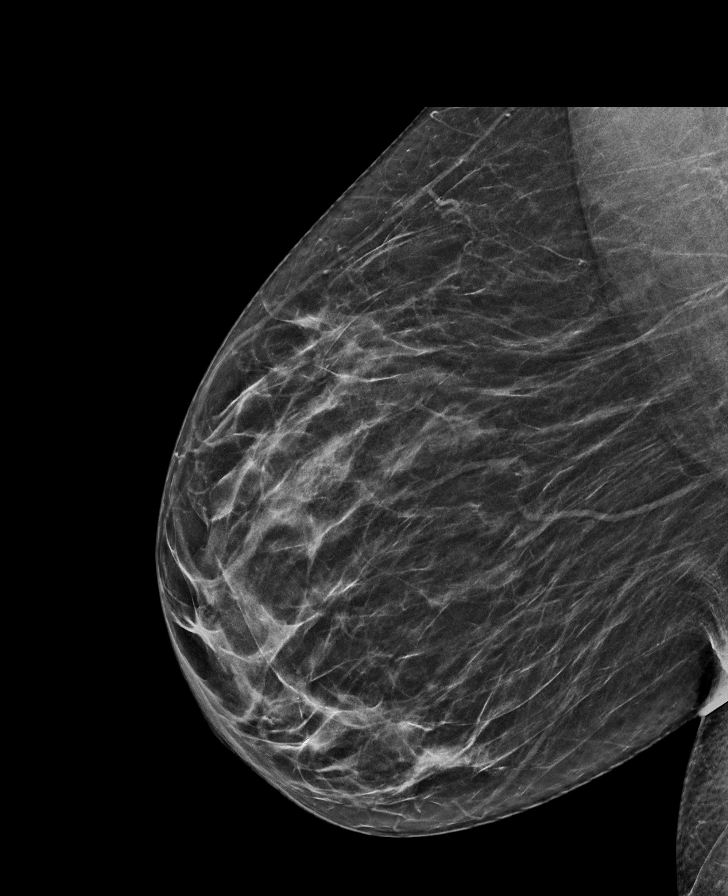

[R CC synth-2D]
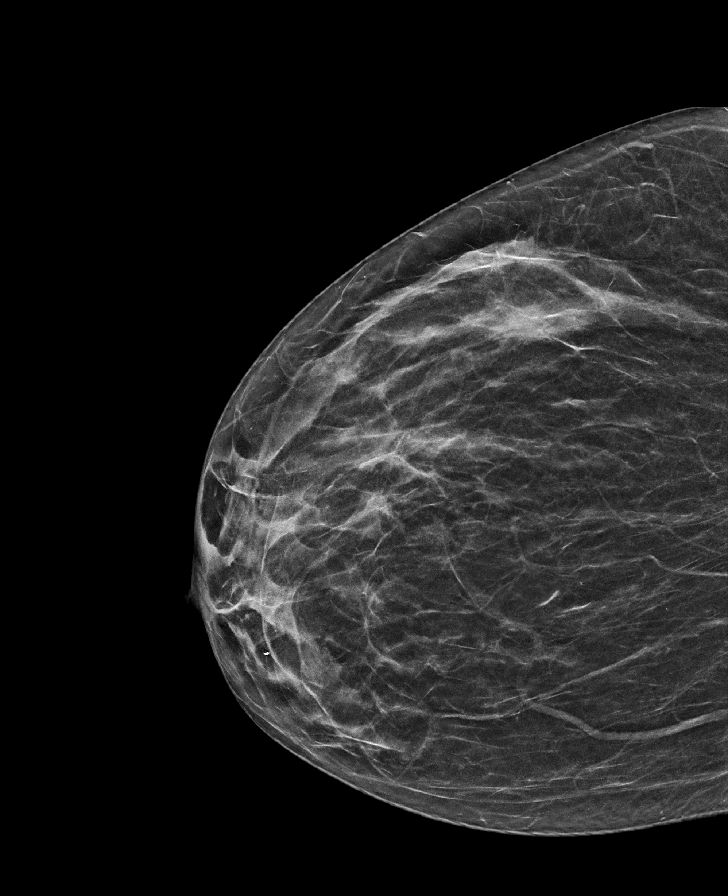

[L CC synth-2D]
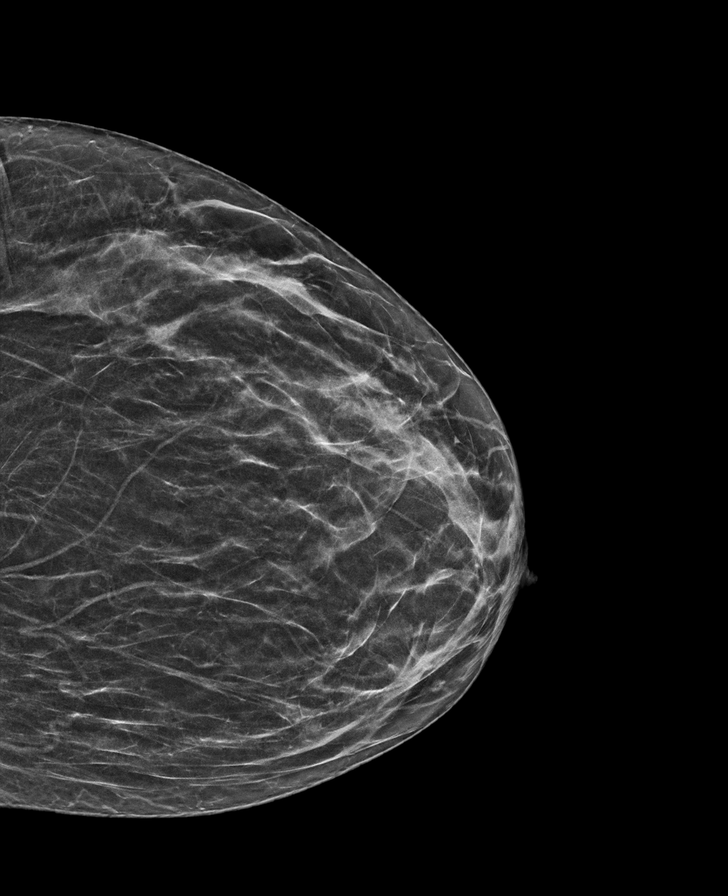

[L CC tomo · tomo slice 27/54.0]
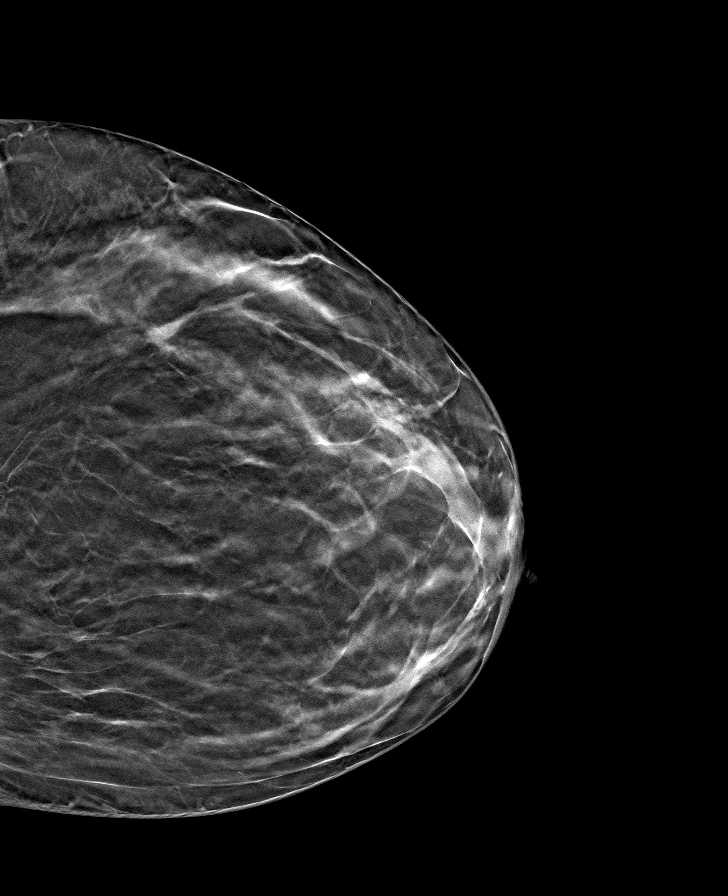

[L MLO tomo · tomo slice 30/59.0]
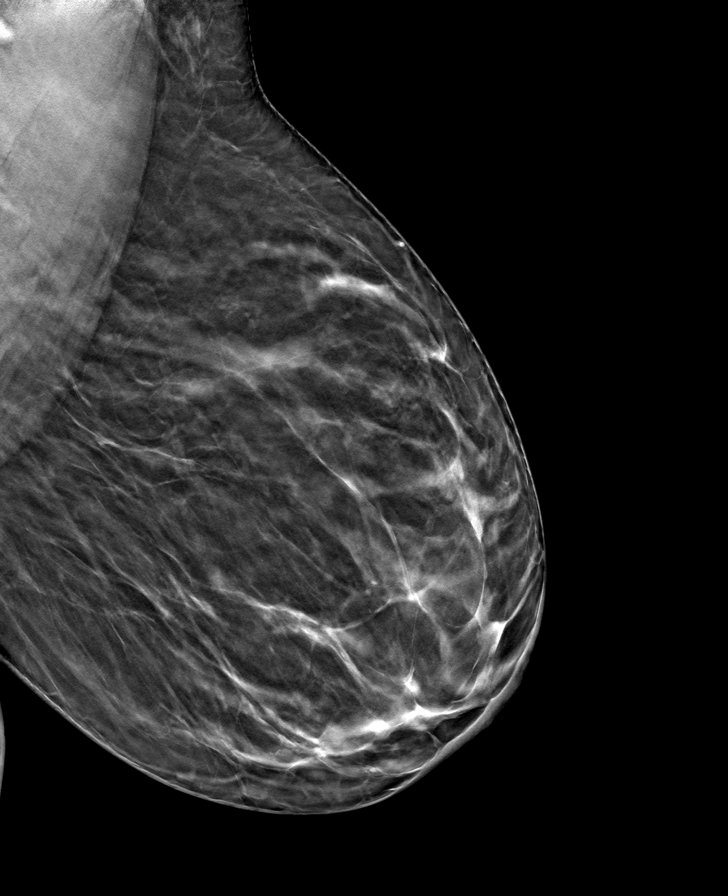

[R CC tomo · tomo slice 27/54.0]
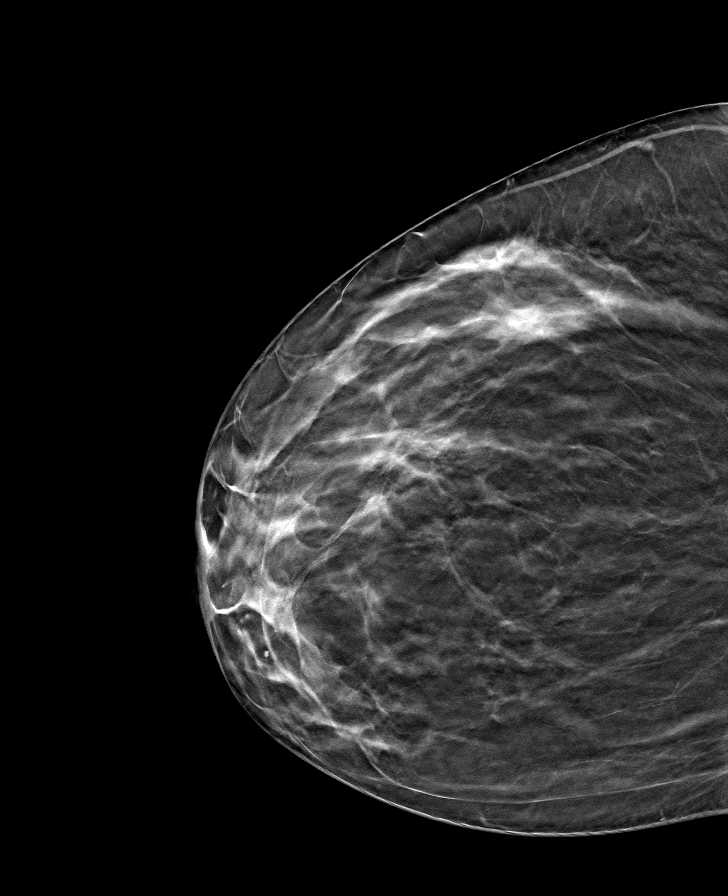

[R MLO tomo · tomo slice 30/59.0]
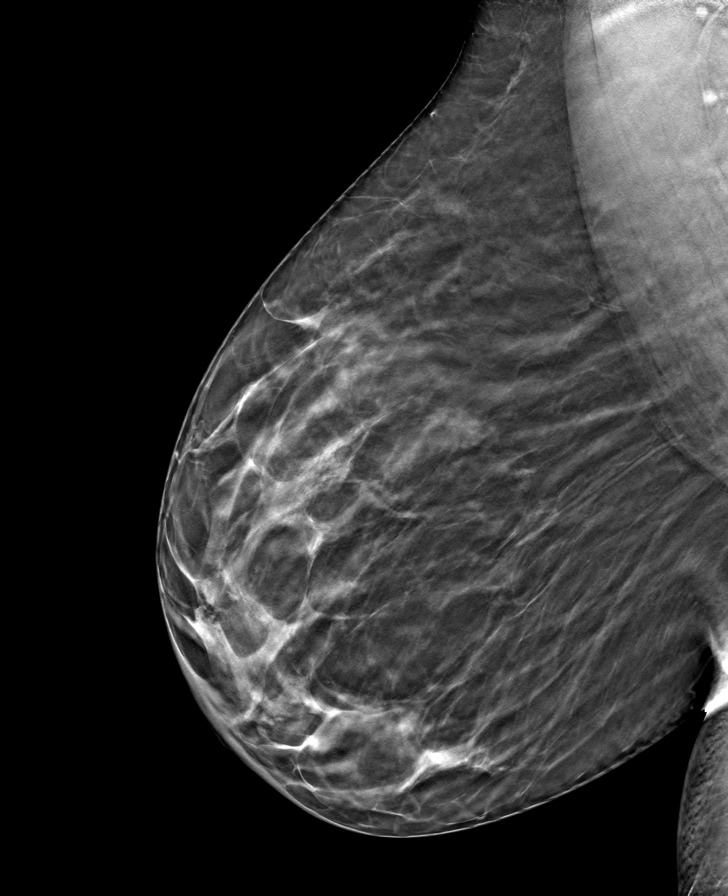

[8 of 24 positions shown; findings below may reference images not displayed]

ACR Breast Density Category b: There are scattered areas of
fibroglandular density.
FINDINGS: There are no findings suspicious for malignancy. The images were
evaluated with computer-aided detection.
IMPRESSION: No mammographic evidence of malignancy. A result letter of this
screening mammogram will be mailed directly to the patient.

RECOMMENDATION:
Screening mammogram in one year. (Code:WJ-I-BG6)

BI-RADS CATEGORY  1: Negative.

## 2022-08-03 DIAGNOSIS — R635 Abnormal weight gain: Secondary | ICD-10-CM | POA: Diagnosis not present

## 2022-08-03 DIAGNOSIS — Z6833 Body mass index (BMI) 33.0-33.9, adult: Secondary | ICD-10-CM | POA: Diagnosis not present

## 2022-08-03 DIAGNOSIS — D649 Anemia, unspecified: Secondary | ICD-10-CM | POA: Diagnosis not present

## 2022-08-03 DIAGNOSIS — E559 Vitamin D deficiency, unspecified: Secondary | ICD-10-CM | POA: Diagnosis not present

## 2022-08-03 DIAGNOSIS — R4586 Emotional lability: Secondary | ICD-10-CM | POA: Diagnosis not present

## 2022-08-29 DIAGNOSIS — Z0000 Encounter for general adult medical examination without abnormal findings: Secondary | ICD-10-CM | POA: Diagnosis not present

## 2022-08-29 DIAGNOSIS — Z131 Encounter for screening for diabetes mellitus: Secondary | ICD-10-CM | POA: Diagnosis not present

## 2022-08-29 DIAGNOSIS — R635 Abnormal weight gain: Secondary | ICD-10-CM | POA: Diagnosis not present

## 2022-08-29 DIAGNOSIS — D649 Anemia, unspecified: Secondary | ICD-10-CM | POA: Diagnosis not present

## 2022-08-29 DIAGNOSIS — Z6833 Body mass index (BMI) 33.0-33.9, adult: Secondary | ICD-10-CM | POA: Diagnosis not present

## 2022-08-29 DIAGNOSIS — E7800 Pure hypercholesterolemia, unspecified: Secondary | ICD-10-CM | POA: Diagnosis not present

## 2022-08-29 DIAGNOSIS — Z1339 Encounter for screening examination for other mental health and behavioral disorders: Secondary | ICD-10-CM | POA: Diagnosis not present

## 2022-08-29 DIAGNOSIS — R5383 Other fatigue: Secondary | ICD-10-CM | POA: Diagnosis not present

## 2022-08-29 DIAGNOSIS — E559 Vitamin D deficiency, unspecified: Secondary | ICD-10-CM | POA: Diagnosis not present

## 2022-08-29 DIAGNOSIS — R4586 Emotional lability: Secondary | ICD-10-CM | POA: Diagnosis not present

## 2022-08-29 DIAGNOSIS — R0602 Shortness of breath: Secondary | ICD-10-CM | POA: Diagnosis not present

## 2022-08-29 DIAGNOSIS — D539 Nutritional anemia, unspecified: Secondary | ICD-10-CM | POA: Diagnosis not present

## 2022-09-16 DIAGNOSIS — F418 Other specified anxiety disorders: Secondary | ICD-10-CM | POA: Diagnosis not present

## 2022-09-16 DIAGNOSIS — Z124 Encounter for screening for malignant neoplasm of cervix: Secondary | ICD-10-CM | POA: Diagnosis not present

## 2022-09-16 DIAGNOSIS — Z01411 Encounter for gynecological examination (general) (routine) with abnormal findings: Secondary | ICD-10-CM | POA: Diagnosis not present

## 2022-09-16 DIAGNOSIS — Z113 Encounter for screening for infections with a predominantly sexual mode of transmission: Secondary | ICD-10-CM | POA: Diagnosis not present

## 2022-09-16 DIAGNOSIS — N925 Other specified irregular menstruation: Secondary | ICD-10-CM | POA: Diagnosis not present

## 2022-09-16 DIAGNOSIS — Z112 Encounter for screening for other bacterial diseases: Secondary | ICD-10-CM | POA: Diagnosis not present

## 2022-09-16 DIAGNOSIS — N958 Other specified menopausal and perimenopausal disorders: Secondary | ICD-10-CM | POA: Diagnosis not present

## 2022-09-30 DIAGNOSIS — F418 Other specified anxiety disorders: Secondary | ICD-10-CM | POA: Diagnosis not present

## 2022-09-30 DIAGNOSIS — D250 Submucous leiomyoma of uterus: Secondary | ICD-10-CM | POA: Diagnosis not present

## 2022-09-30 DIAGNOSIS — A6009 Herpesviral infection of other urogenital tract: Secondary | ICD-10-CM | POA: Diagnosis not present

## 2022-09-30 DIAGNOSIS — D251 Intramural leiomyoma of uterus: Secondary | ICD-10-CM | POA: Diagnosis not present

## 2022-10-03 DIAGNOSIS — R4586 Emotional lability: Secondary | ICD-10-CM | POA: Diagnosis not present

## 2022-10-03 DIAGNOSIS — Z6833 Body mass index (BMI) 33.0-33.9, adult: Secondary | ICD-10-CM | POA: Diagnosis not present

## 2022-10-03 DIAGNOSIS — D649 Anemia, unspecified: Secondary | ICD-10-CM | POA: Diagnosis not present

## 2022-10-03 DIAGNOSIS — N951 Menopausal and female climacteric states: Secondary | ICD-10-CM | POA: Diagnosis not present

## 2022-10-03 DIAGNOSIS — R635 Abnormal weight gain: Secondary | ICD-10-CM | POA: Diagnosis not present

## 2022-10-27 ENCOUNTER — Other Ambulatory Visit: Payer: Self-pay | Admitting: Obstetrics & Gynecology

## 2022-11-14 ENCOUNTER — Ambulatory Visit (HOSPITAL_BASED_OUTPATIENT_CLINIC_OR_DEPARTMENT_OTHER): Admit: 2022-11-14 | Payer: BC Managed Care – PPO | Admitting: Obstetrics & Gynecology

## 2022-11-14 ENCOUNTER — Encounter (HOSPITAL_BASED_OUTPATIENT_CLINIC_OR_DEPARTMENT_OTHER): Payer: Self-pay

## 2022-11-14 SURGERY — DILATATION & CURETTAGE/HYSTEROSCOPY WITH MYOSURE
Anesthesia: General

## 2022-11-28 NOTE — Progress Notes (Unsigned)
New patient visit   Patient: Cheryl Wiggins   DOB: Jul 21, 1970   52 y.o. Female  MRN: [id number] Visit Date: 12/01/2022  Today's healthcare provider: Alfredia Ferguson, PA-C   No chief complaint on file.  Subjective    Cheryl Wiggins is a 52 y.o. female who presents today as a new patient to establish care.  HPI  ***  Past Medical History:  Diagnosis Date   Hypertension    Past Surgical History:  Procedure Laterality Date   BREAST BIOPSY Right 10/27/2016   benign   Family Status  Relation Name Status   Mother  (Not Specified)  No partnership data on file   Family History  Problem Relation Age of Onset   Breast cancer Mother 52   Social History   Socioeconomic History   Marital status: Single    Spouse name: Not on file   Number of children: Not on file   Years of education: Not on file   Highest education level: Not on file  Occupational History   Not on file  Tobacco Use   Smoking status: Never   Smokeless tobacco: Never  Substance and Sexual Activity   Alcohol use: No   Drug use: Not on file   Sexual activity: Never  Other Topics Concern   Not on file  Social History Narrative   Not on file   Social Determinants of Health   Financial Resource Strain: Not on file  Food Insecurity: Not on file  Transportation Needs: Not on file  Physical Activity: Not on file  Stress: Not on file  Social Connections: Not on file   Outpatient Medications Prior to Visit  Medication Sig   hydrochlorothiazide (HYDRODIURIL) 12.5 MG tablet Take 1 tablet (12.5 mg total) by mouth daily.   losartan (COZAAR) 50 MG tablet Take 1 tablet (50 mg total) by mouth daily.   ondansetron (ZOFRAN ODT) 4 MG disintegrating tablet Take 1 tablet (4 mg total) by mouth every 6 (six) hours as needed for nausea or vomiting.   No facility-administered medications prior to visit.   No Known Allergies   There is no immunization history on file for this patient.  Health Maintenance  Topic  Date Due   HIV Screening  Never done   Hepatitis C Screening  Never done   DTaP/Tdap/Td (1 - Tdap) Never done   Colonoscopy  Never done   Cervical Cancer Screening (HPV/Pap Cotest)  09/21/2017   Zoster Vaccines- Shingrix (1 of 2) Never done   MAMMOGRAM  08/12/2022   INFLUENZA VACCINE  Never done   COVID-19 Vaccine (1 - 2023-24 season) Never done   HPV VACCINES  Aged Out    Patient Care Team: Patient, No Pcp Per as PCP - General (General Practice)  Review of Systems  {Insert previous labs (optional):23779} {See past labs  Heme  Chem  Endocrine  Serology  Results Review (optional):1}   Objective    There were no vitals taken for this visit. {Insert last BP/Wt (optional):23777}{See vitals history (optional):1}   Physical Exam ***  Depression Screen     No data to display         No results found for any visits on 12/01/22.  Assessment & Plan     ***  No follow-ups on file.     {provider attestation***:1}   Alfredia Ferguson, PA-C  [facility] Primary Care at [facility] [phone] (phone) [phone] (fax)  [facility]

## 2022-12-01 ENCOUNTER — Telehealth (HOSPITAL_BASED_OUTPATIENT_CLINIC_OR_DEPARTMENT_OTHER): Payer: Self-pay

## 2022-12-01 ENCOUNTER — Ambulatory Visit: Payer: BC Managed Care – PPO | Admitting: Physician Assistant

## 2022-12-01 ENCOUNTER — Ambulatory Visit (INDEPENDENT_AMBULATORY_CARE_PROVIDER_SITE_OTHER): Payer: BC Managed Care – PPO

## 2022-12-01 ENCOUNTER — Encounter: Payer: Self-pay | Admitting: Physician Assistant

## 2022-12-01 VITALS — BP 140/88 | HR 89 | Temp 98.2°F | Ht 67.0 in | Wt 216.0 lb

## 2022-12-01 DIAGNOSIS — D649 Anemia, unspecified: Secondary | ICD-10-CM | POA: Diagnosis not present

## 2022-12-01 DIAGNOSIS — I10 Essential (primary) hypertension: Secondary | ICD-10-CM | POA: Insufficient documentation

## 2022-12-01 DIAGNOSIS — R112 Nausea with vomiting, unspecified: Secondary | ICD-10-CM

## 2022-12-01 DIAGNOSIS — R519 Headache, unspecified: Secondary | ICD-10-CM | POA: Diagnosis not present

## 2022-12-01 DIAGNOSIS — N951 Menopausal and female climacteric states: Secondary | ICD-10-CM

## 2022-12-01 DIAGNOSIS — R42 Dizziness and giddiness: Secondary | ICD-10-CM | POA: Diagnosis not present

## 2022-12-01 LAB — COMPREHENSIVE METABOLIC PANEL
ALT: 15 U/L (ref 0–35)
AST: 19 U/L (ref 0–37)
Albumin: 4 g/dL (ref 3.5–5.2)
Alkaline Phosphatase: 64 U/L (ref 39–117)
BUN: 8 mg/dL (ref 6–23)
CO2: 27 mEq/L (ref 19–32)
Calcium: 9.3 mg/dL (ref 8.4–10.5)
Chloride: 106 mEq/L (ref 96–112)
Creatinine, Ser: 1.07 mg/dL (ref 0.40–1.20)
GFR: 58.91 mL/min — ABNORMAL LOW (ref >60.00)
Glucose, Bld: 91 mg/dL (ref 70–99)
Potassium: 4.5 mEq/L (ref 3.5–5.1)
Sodium: 141 mEq/L (ref 135–145)
Total Bilirubin: 0.3 mg/dL (ref 0.2–1.2)
Total Protein: 6.6 g/dL (ref 6.0–8.3)

## 2022-12-01 LAB — CBC WITH DIFFERENTIAL/PLATELET
Basophils Absolute: 0.1 10*3/uL (ref 0.0–0.1)
Basophils Relative: 1.1 % (ref 0.0–3.0)
Eosinophils Absolute: 0.3 10*3/uL (ref 0.0–0.7)
Eosinophils Relative: 3.9 % (ref 0.0–5.0)
HCT: 30.4 % — ABNORMAL LOW (ref 36.0–46.0)
Hemoglobin: 9.4 g/dL — ABNORMAL LOW (ref 12.0–15.0)
Lymphocytes Relative: 29.2 % (ref 12.0–46.0)
Lymphs Abs: 2.1 10*3/uL (ref 0.7–4.0)
MCHC: 31 g/dL (ref 30.0–36.0)
MCV: 76.6 fl — ABNORMAL LOW (ref 78.0–100.0)
Monocytes Absolute: 0.5 10*3/uL (ref 0.1–1.0)
Monocytes Relative: 7 % (ref 3.0–12.0)
Neutro Abs: 4.2 10*3/uL (ref 1.4–7.7)
Neutrophils Relative %: 58.8 % (ref 43.0–77.0)
Platelets: 454 10*3/uL — ABNORMAL HIGH (ref 150.0–400.0)
RBC: 3.97 Mil/uL (ref 3.87–5.11)
RDW: 18.1 % — ABNORMAL HIGH (ref 11.5–15.5)
WBC: 7.1 10*3/uL (ref 4.0–10.5)

## 2022-12-01 LAB — IBC + FERRITIN
Ferritin: 4.9 ng/mL — ABNORMAL LOW (ref 10.0–291.0)
Iron: 25 ug/dL — ABNORMAL LOW (ref 42–145)
Saturation Ratios: 4.9 % — ABNORMAL LOW (ref 20.0–50.0)
TIBC: 513.8 ug/dL — ABNORMAL HIGH (ref 250.0–450.0)
Transferrin: 367 mg/dL — ABNORMAL HIGH (ref 212.0–360.0)

## 2022-12-01 LAB — T4, FREE: Free T4: 0.65 ng/dL (ref 0.60–1.60)

## 2022-12-01 LAB — B12 AND FOLATE PANEL
Folate: 11 ng/mL (ref >5.9)
Vitamin B-12: 310 pg/mL (ref 211–911)

## 2022-12-01 LAB — LIPASE: Lipase: 19 U/L (ref 11.0–59.0)

## 2022-12-01 LAB — TSH: TSH: 1.99 u[IU]/mL (ref 0.35–5.50)

## 2022-12-01 NOTE — Addendum Note (Signed)
Addended byConrad Ottum[place] D on: 12/01/2022 02:30 PM   Modules accepted: Orders

## 2022-12-02 ENCOUNTER — Telehealth: Payer: Self-pay | Admitting: Physician Assistant

## 2022-12-02 ENCOUNTER — Other Ambulatory Visit: Payer: Self-pay | Admitting: Physician Assistant

## 2022-12-02 DIAGNOSIS — D509 Iron deficiency anemia, unspecified: Secondary | ICD-10-CM

## 2022-12-02 MED ORDER — IRON (FERROUS SULFATE) 325 (65 FE) MG PO TABS
325.0000 mg | ORAL_TABLET | Freq: Every day | ORAL | 1 refills | Status: DC
Start: 2022-12-02 — End: 2023-08-18

## 2022-12-02 NOTE — Telephone Encounter (Signed)
Pt would like to go over her labs with a nurse.

## 2022-12-02 NOTE — Telephone Encounter (Signed)
See result notes. 

## 2022-12-03 ENCOUNTER — Other Ambulatory Visit: Payer: Self-pay | Admitting: Physician Assistant

## 2022-12-03 DIAGNOSIS — D509 Iron deficiency anemia, unspecified: Secondary | ICD-10-CM

## 2022-12-07 ENCOUNTER — Ambulatory Visit (HOSPITAL_BASED_OUTPATIENT_CLINIC_OR_DEPARTMENT_OTHER): Payer: BC Managed Care – PPO

## 2022-12-09 ENCOUNTER — Ambulatory Visit (INDEPENDENT_AMBULATORY_CARE_PROVIDER_SITE_OTHER): Payer: BC Managed Care – PPO | Admitting: Physician Assistant

## 2022-12-09 ENCOUNTER — Encounter: Payer: Self-pay | Admitting: Physician Assistant

## 2022-12-09 ENCOUNTER — Ambulatory Visit: Payer: BC Managed Care – PPO | Attending: Physician Assistant

## 2022-12-09 VITALS — BP 136/72 | HR 80 | Temp 98.2°F | Resp 16 | Ht 67.0 in | Wt 218.0 lb

## 2022-12-09 DIAGNOSIS — R61 Generalized hyperhidrosis: Secondary | ICD-10-CM

## 2022-12-09 DIAGNOSIS — R002 Palpitations: Secondary | ICD-10-CM

## 2022-12-09 DIAGNOSIS — R42 Dizziness and giddiness: Secondary | ICD-10-CM

## 2022-12-09 DIAGNOSIS — D509 Iron deficiency anemia, unspecified: Secondary | ICD-10-CM | POA: Insufficient documentation

## 2022-12-09 MED ORDER — MECLIZINE HCL 25 MG PO TABS: 25.0000 mg | ORAL_TABLET | Freq: Three times a day (TID) | ORAL | 0 refills | Status: DC | PRN

## 2022-12-09 NOTE — Assessment & Plan Note (Signed)
Recommending starting iron supplements asap, Discussed the option of taking Miralax to mitigate constipation. I had also referred to heme for infusions given the extent of her symptoms for more rapid relief. Given pt their contact info.

## 2022-12-09 NOTE — Progress Notes (Unsigned)
[facility]-SW EP to read

## 2022-12-09 NOTE — Progress Notes (Signed)
Established patient visit   Patient: Cheryl Wiggins   DOB: [birth year]   52 y.o. Female  MRN: [id number] Visit Date: 12/09/2022  Today's healthcare provider: Alfredia Ferguson, PA-C    Subjective    HPI Discussed the use of AI scribe software for clinical note transcription with the patient, who gave verbal consent to proceed.  History of Present Illness   The patient, with a known history of anemia, presents with ongoing symptoms of dizziness, heart palpitations, and episodes of vomiting.    The patient describes the dizziness as a feeling of wooziness and floating, which is exacerbated by changes in position such as going from laying down to sitting up. The heart palpitations are described as a racing heart, occurring daily and often triggered by physical activity such as walking. The patient also experiences episodes of vomiting, which are often preceded by a feeling of lightheadedness, sweating, and shortness of breath. These symptoms have been significantly impacting the patient's daily activities, including their ability to work.   As last visit we found she has IDA, she did pick up her iron supplements but has not started them yet. She recalls the last time she had to take iron she could not 2/2 constipation.    She mentions several times how her symptoms are impacting her job.      Medications: Outpatient Medications Prior to Visit  Medication Sig   AUROVELA 24 FE 1-20 MG-MCG(24) tablet Take 1 tablet by mouth daily.   buPROPion (WELLBUTRIN XL) 300 MG 24 hr tablet Take 300 mg by mouth daily.   DULoxetine (CYMBALTA) 20 MG capsule Take 20 mg by mouth 2 (two) times daily.   Iron, Ferrous Sulfate, 325 (65 Fe) MG TABS Take 325 mg by mouth daily.   losartan (COZAAR) 50 MG tablet Take 1 tablet (50 mg total) by mouth daily.   omeprazole (PRILOSEC) 20 MG capsule Take 20 mg by mouth daily.   valACYclovir (VALTREX) 500 MG tablet Take 500 mg by mouth daily.   [DISCONTINUED]  ondansetron (ZOFRAN ODT) 4 MG disintegrating tablet Take 1 tablet (4 mg total) by mouth every 6 (six) hours as needed for nausea or vomiting. (Patient not taking: Reported on 12/09/2022)   No facility-administered medications prior to visit.    Review of Systems  Constitutional:  Negative for fatigue and fever.  Respiratory:  Negative for cough and shortness of breath.   Cardiovascular:  Positive for palpitations. Negative for chest pain and leg swelling.  Gastrointestinal:  Positive for vomiting. Negative for abdominal pain.  Neurological:  Positive for dizziness and headaches.      Objective    BP 136/72   Pulse 80   Temp 98.2 F (36.8 C) (Oral)   Resp 16   Ht 5\' 7"  (1.702 m)   Wt 218 lb (98.9 kg)   LMP 11/09/2022   SpO2 97%   BMI 34.14 kg/m   Physical Exam Vitals reviewed.  Constitutional:      Appearance: She is not ill-appearing.  HENT:     Head: Normocephalic.  Eyes:     Conjunctiva/sclera: Conjunctivae normal.  Cardiovascular:     Rate and Rhythm: Normal rate.  Pulmonary:     Effort: Pulmonary effort is normal. No respiratory distress.  Neurological:     General: No focal deficit present.     Mental Status: She is alert and oriented to person, place, and time.     Comments: Attempted a dix hallpike test-- pt felt extremely dizzy  and nauseous when going from sitting to laying and we aborted the test. No nystagmus appreciated but rapid eye movements and blinking  Psychiatric:        Mood and Affect: Mood normal.        Behavior: Behavior normal.     No results found for any visits on 12/09/22.  Assessment & Plan     1. Dizziness 2. Diaphoresis 3. Palpitations No chest pain reported. Discussed the possibility of vertigo and cardiac rhythm causes. Advised some of her symptoms could be explained by IDA -- but severe dizziness w/ vomiting is not a symptom.  -Order an at home heart monitor to rule out cardiac causes for symptoms. -Refer to neurology for  further evaluation of dizziness. - I had ordered an MRI -- but pt cancelled the appointment. -Prescribe Meclizine for dizziness and nausea.  - Ambulatory referral to Neurology - LONG TERM MONITOR (3-14 DAYS); Future - meclizine (ANTIVERT) 25 MG tablet; Take 1 tablet (25 mg total) by mouth 3 (three) times daily as needed for dizziness.  Dispense: 30 tablet; Refill: 0  4. Iron deficiency anemia, unspecified iron deficiency anemia type Recommending starting iron supplements asap, Discussed the option of taking Miralax to mitigate constipation. I had also referred to heme for infusions given the extent of her symptoms for more rapid relief. Given pt their contact info.    Quality of Life -Advise patient to discuss short-term disability or FMLA with HR at work. I would be absolutely willing to complete this paperwork for her. -Stressed the importance of following up for anemia treatment, with neurology, and with the cardiac monitor.  -The ED is always an option for severe symptoms.  Return if symptoms worsen or fail to improve.      I, Alfredia Ferguson, PA-C have reviewed all documentation for this visit. The documentation on  12/09/22   for the exam, diagnosis, procedures, and orders are all accurate and complete.    Alfredia Ferguson, PA-C  [facility] Primary Care at [facility] [phone] (phone) [phone] (fax)  [facility]

## 2022-12-09 NOTE — Patient Instructions (Signed)
Hematology : [phone]

## 2022-12-12 ENCOUNTER — Ambulatory Visit: Payer: BC Managed Care – PPO | Admitting: Physician Assistant

## 2022-12-12 ENCOUNTER — Other Ambulatory Visit: Payer: Self-pay | Admitting: Physician Assistant

## 2022-12-12 DIAGNOSIS — Z1211 Encounter for screening for malignant neoplasm of colon: Secondary | ICD-10-CM

## 2022-12-12 DIAGNOSIS — Z1212 Encounter for screening for malignant neoplasm of rectum: Secondary | ICD-10-CM

## 2023-01-19 ENCOUNTER — Telehealth: Payer: Self-pay

## 2023-01-19 NOTE — Telephone Encounter (Signed)
Initial Comment Caller states she has shortness of breath and heart palpitations. She also states she is Anemic so she wonders if she needs to have a iron infusion dine before her appt on the 13th. Dr. Ok Edwards is not listed. Translation No Disp. Time Lamount Cohen Time) Disposition Final User 01/19/2023 3:35:10 PM Send to Urgent Felisa Bonier 01/19/2023 3:48:05 PM Attempt made - message left Mort Sawyers RN, Arline Asp 01/19/2023 3:49:08 PM Send To RN Personal Mort Sawyers, RN, Cindy 01/19/2023 3:50:16 PM Send To RN Personal Mort Sawyers, RN, Cindy 01/19/2023 3:54:47 PM Send To RN Personal Mort Sawyers, RN, Cindy 01/19/2023 4:15:08 PM Attempt made - message left [place], RN, Morgyn 01/19/2023 4:24:51 PM FINAL ATTEMPT MADE - message left Yes Danie Chandler RN, Morgyn Final Disposition 01/19/2023 4:24:51 PM FINAL ATTEMPT MADE - message left Yes Danie Chandler, RN, Morgyn

## 2023-01-20 ENCOUNTER — Ambulatory Visit: Payer: BC Managed Care – PPO | Admitting: Physician Assistant

## 2023-01-20 NOTE — Telephone Encounter (Signed)
Appt today w/ PCP.  

## 2023-01-21 ENCOUNTER — Encounter: Payer: BC Managed Care – PPO | Admitting: Medical Oncology

## 2023-01-21 ENCOUNTER — Other Ambulatory Visit: Payer: BC Managed Care – PPO

## 2023-01-30 ENCOUNTER — Ambulatory Visit: Payer: BC Managed Care – PPO | Admitting: Physician Assistant

## 2023-01-30 NOTE — Progress Notes (Deleted)
      Established patient visit   Patient: Cheryl Wiggins   DOB: 1971/02/04   52 y.o. Female  MRN: [id number] Visit Date: 01/30/2023  Today's healthcare provider: Alfredia Ferguson, PA-C   No chief complaint on file.  Subjective     ***  Medications: Outpatient Medications Prior to Visit  Medication Sig   AUROVELA 24 FE 1-20 MG-MCG(24) tablet Take 1 tablet by mouth daily.   buPROPion (WELLBUTRIN XL) 300 MG 24 hr tablet Take 300 mg by mouth daily.   DULoxetine (CYMBALTA) 20 MG capsule Take 20 mg by mouth 2 (two) times daily.   Iron, Ferrous Sulfate, 325 (65 Fe) MG TABS Take 325 mg by mouth daily.   losartan (COZAAR) 50 MG tablet Take 1 tablet (50 mg total) by mouth daily.   meclizine (ANTIVERT) 25 MG tablet Take 1 tablet (25 mg total) by mouth 3 (three) times daily as needed for dizziness.   omeprazole (PRILOSEC) 20 MG capsule Take 20 mg by mouth daily.   valACYclovir (VALTREX) 500 MG tablet Take 500 mg by mouth daily.   No facility-administered medications prior to visit.    Review of Systems {Insert previous labs (optional):23779} {See past labs  Heme  Chem  Endocrine  Serology  Results Review (optional):1}   Objective    There were no vitals taken for this visit. {Insert last BP/Wt (optional):23777}{See vitals history (optional):1}  Physical Exam  ***  No results found for any visits on 01/30/23.  Assessment & Plan    There are no diagnoses linked to this encounter.  ***  No follow-ups on file.       Alfredia Ferguson, PA-C  [facility] Primary Care at [facility] [phone] (phone) [phone] (fax)  [facility]

## 2023-03-06 ENCOUNTER — Other Ambulatory Visit: Payer: Self-pay | Admitting: Family

## 2023-03-06 ENCOUNTER — Inpatient Hospital Stay: Payer: BC Managed Care – PPO

## 2023-03-06 ENCOUNTER — Inpatient Hospital Stay: Payer: BC Managed Care – PPO | Attending: Family | Admitting: Family

## 2023-03-06 DIAGNOSIS — D649 Anemia, unspecified: Secondary | ICD-10-CM

## 2023-03-06 DIAGNOSIS — D509 Iron deficiency anemia, unspecified: Secondary | ICD-10-CM

## 2023-04-11 DIAGNOSIS — R7303 Prediabetes: Secondary | ICD-10-CM | POA: Diagnosis not present

## 2023-04-11 DIAGNOSIS — Z6834 Body mass index (BMI) 34.0-34.9, adult: Secondary | ICD-10-CM | POA: Diagnosis not present

## 2023-04-11 DIAGNOSIS — J069 Acute upper respiratory infection, unspecified: Secondary | ICD-10-CM | POA: Diagnosis not present

## 2023-04-11 DIAGNOSIS — E785 Hyperlipidemia, unspecified: Secondary | ICD-10-CM | POA: Diagnosis not present

## 2023-04-11 DIAGNOSIS — Z79899 Other long term (current) drug therapy: Secondary | ICD-10-CM | POA: Diagnosis not present

## 2023-04-11 DIAGNOSIS — R059 Cough, unspecified: Secondary | ICD-10-CM | POA: Diagnosis not present

## 2023-04-11 DIAGNOSIS — E611 Iron deficiency: Secondary | ICD-10-CM | POA: Diagnosis not present

## 2023-04-11 DIAGNOSIS — E559 Vitamin D deficiency, unspecified: Secondary | ICD-10-CM | POA: Diagnosis not present

## 2023-05-06 DIAGNOSIS — Z124 Encounter for screening for malignant neoplasm of cervix: Secondary | ICD-10-CM | POA: Diagnosis not present

## 2023-05-06 DIAGNOSIS — Z01419 Encounter for gynecological examination (general) (routine) without abnormal findings: Secondary | ICD-10-CM | POA: Diagnosis not present

## 2023-05-06 DIAGNOSIS — N898 Other specified noninflammatory disorders of vagina: Secondary | ICD-10-CM | POA: Diagnosis not present

## 2023-05-06 DIAGNOSIS — N939 Abnormal uterine and vaginal bleeding, unspecified: Secondary | ICD-10-CM | POA: Diagnosis not present

## 2023-05-06 DIAGNOSIS — R350 Frequency of micturition: Secondary | ICD-10-CM | POA: Diagnosis not present

## 2023-05-06 DIAGNOSIS — Z1151 Encounter for screening for human papillomavirus (HPV): Secondary | ICD-10-CM | POA: Diagnosis not present

## 2023-05-06 DIAGNOSIS — Z113 Encounter for screening for infections with a predominantly sexual mode of transmission: Secondary | ICD-10-CM | POA: Diagnosis not present

## 2023-05-06 DIAGNOSIS — A6004 Herpesviral vulvovaginitis: Secondary | ICD-10-CM | POA: Diagnosis not present

## 2023-05-06 DIAGNOSIS — D259 Leiomyoma of uterus, unspecified: Secondary | ICD-10-CM | POA: Diagnosis not present

## 2023-05-13 DIAGNOSIS — N939 Abnormal uterine and vaginal bleeding, unspecified: Secondary | ICD-10-CM | POA: Diagnosis not present

## 2023-05-13 DIAGNOSIS — Z3202 Encounter for pregnancy test, result negative: Secondary | ICD-10-CM | POA: Diagnosis not present

## 2023-05-13 DIAGNOSIS — D259 Leiomyoma of uterus, unspecified: Secondary | ICD-10-CM | POA: Diagnosis not present

## 2023-05-13 DIAGNOSIS — R9389 Abnormal findings on diagnostic imaging of other specified body structures: Secondary | ICD-10-CM | POA: Diagnosis not present

## 2023-05-15 LAB — HM PAP SMEAR: HPV Aptima: NEGATIVE

## 2023-06-09 ENCOUNTER — Other Ambulatory Visit (HOSPITAL_BASED_OUTPATIENT_CLINIC_OR_DEPARTMENT_OTHER): Payer: Self-pay

## 2023-06-09 DIAGNOSIS — N183 Chronic kidney disease, stage 3 unspecified: Secondary | ICD-10-CM | POA: Diagnosis not present

## 2023-06-09 DIAGNOSIS — R635 Abnormal weight gain: Secondary | ICD-10-CM | POA: Diagnosis not present

## 2023-06-09 DIAGNOSIS — D508 Other iron deficiency anemias: Secondary | ICD-10-CM | POA: Diagnosis not present

## 2023-06-09 DIAGNOSIS — I130 Hypertensive heart and chronic kidney disease with heart failure and stage 1 through stage 4 chronic kidney disease, or unspecified chronic kidney disease: Secondary | ICD-10-CM | POA: Diagnosis not present

## 2023-06-09 DIAGNOSIS — Z79899 Other long term (current) drug therapy: Secondary | ICD-10-CM | POA: Diagnosis not present

## 2023-06-09 DIAGNOSIS — Z6834 Body mass index (BMI) 34.0-34.9, adult: Secondary | ICD-10-CM | POA: Diagnosis not present

## 2023-06-09 MED ORDER — BUPROPION HCL ER (XL) 300 MG PO TB24
300.0000 mg | ORAL_TABLET | Freq: Every day | ORAL | 0 refills | Status: DC
  Filled 2023-06-09: qty 90, 90d supply, fill #0

## 2023-06-09 MED ORDER — LOSARTAN POTASSIUM-HCTZ 50-12.5 MG PO TABS
1.0000 | ORAL_TABLET | Freq: Every day | ORAL | 0 refills | Status: DC
  Filled 2023-06-09: qty 90, 90d supply, fill #0

## 2023-06-09 MED ORDER — DULOXETINE HCL 20 MG PO CPEP
20.0000 mg | ORAL_CAPSULE | Freq: Two times a day (BID) | ORAL | 0 refills | Status: DC
  Filled 2023-06-09: qty 180, 90d supply, fill #0

## 2023-06-15 ENCOUNTER — Other Ambulatory Visit (HOSPITAL_BASED_OUTPATIENT_CLINIC_OR_DEPARTMENT_OTHER): Payer: Self-pay

## 2023-08-14 ENCOUNTER — Encounter: Payer: Self-pay | Admitting: Physician Assistant

## 2023-08-14 ENCOUNTER — Ambulatory Visit: Admitting: Physician Assistant

## 2023-08-14 VITALS — BP 136/78 | HR 76 | Ht 67.0 in | Wt 214.8 lb

## 2023-08-14 DIAGNOSIS — E669 Obesity, unspecified: Secondary | ICD-10-CM

## 2023-08-14 DIAGNOSIS — F333 Major depressive disorder, recurrent, severe with psychotic symptoms: Secondary | ICD-10-CM

## 2023-08-14 DIAGNOSIS — N912 Amenorrhea, unspecified: Secondary | ICD-10-CM

## 2023-08-14 DIAGNOSIS — D509 Iron deficiency anemia, unspecified: Secondary | ICD-10-CM | POA: Diagnosis not present

## 2023-08-14 DIAGNOSIS — R42 Dizziness and giddiness: Secondary | ICD-10-CM | POA: Diagnosis not present

## 2023-08-14 DIAGNOSIS — F411 Generalized anxiety disorder: Secondary | ICD-10-CM

## 2023-08-14 MED ORDER — DULOXETINE HCL 30 MG PO CPEP
30.0000 mg | ORAL_CAPSULE | Freq: Every day | ORAL | 0 refills | Status: DC
Start: 2023-08-14 — End: 2023-12-16

## 2023-08-14 MED ORDER — MECLIZINE HCL 12.5 MG PO TABS: 12.5000 mg | ORAL_TABLET | Freq: Three times a day (TID) | ORAL | 0 refills | Status: DC | PRN

## 2023-08-14 MED ORDER — BUPROPION HCL ER (XL) 300 MG PO TB24: 300.0000 mg | ORAL_TABLET | Freq: Every day | ORAL | 0 refills | Status: DC

## 2023-08-14 NOTE — Progress Notes (Signed)
 Established patient visit   Patient: Cheryl Wiggins   DOB: Jul 16, 1970   53 y.o. Female  MRN: [id number] Visit Date: 08/14/2023  Today's healthcare provider: Trenton Frock, PA-C   Cc. Follow up, dizziness, anxiety/depression  Subjective    Discussed the use of AI scribe software for clinical note transcription with the patient, who gave verbal consent to proceed.  History of Present Illness   Cheryl Wiggins is a 53 year old female with anxiety, depression, and anemia who presents with dizziness and medication management.  She experiences persistent dizziness with a sensation of being off-balance, which worsens when looking at screens, leading to nausea and vomiting. This has affected her ability to work.  She has iron  deficiency anemia and experiences constipation with iron  supplements. She has not taken them consistently. She has uterine fibroids but has not menstruated since April. She wonders about menopause.  She has been on and off anxiety medication since 2002. She was taking Cymbalta  and Wellbutrin  but is currently off them due to a pharmacy mix-up. These medications were effective when taken.  She uses Wegovy for weight loss, recently re-started on 0.25 mg weekly.  Pt has been seeing another pcp at [facility] while she has seen us  as well . She was under the impression she could be seen by two different primary providers as long as she is being seen for different concerns. I am unable to view [facility] notes today.  She reports the cymbalta , wellbutrin , and wegovy were being prescribed by [facility].    Medications: Outpatient Medications Prior to Visit  Medication Sig   Semaglutide-Weight Management (WEGOVY) 0.25 MG/0.5ML SOAJ Inject 0.25 mg into the skin once a week.   Iron , Ferrous Sulfate , 325 (65 Fe) MG TABS Take 325 mg by mouth daily.   losartan -hydrochlorothiazide  (HYZAAR ) 50-12.5 MG tablet Take 1 tablet by mouth daily.   omeprazole (PRILOSEC) 20 MG capsule  Take 20 mg by mouth daily.   valACYclovir (VALTREX) 500 MG tablet Take 500 mg by mouth daily.   [DISCONTINUED] AUROVELA 24 FE 1-20 MG-MCG(24) tablet Take 1 tablet by mouth daily.   [DISCONTINUED] buPROPion  (WELLBUTRIN  XL) 300 MG 24 hr tablet Take 300 mg by mouth daily. (Patient not taking: Reported on 08/14/2023)   [DISCONTINUED] buPROPion  (WELLBUTRIN  XL) 300 MG 24 hr tablet Take 1 tablet (300 mg total) by mouth daily.   [DISCONTINUED] DULoxetine  (CYMBALTA ) 20 MG capsule Take 20 mg by mouth 2 (two) times daily.   [DISCONTINUED] DULoxetine  (CYMBALTA ) 20 MG capsule Take 1 capsule (20 mg total) by mouth 2 (two) times daily.   [DISCONTINUED] losartan  (COZAAR ) 50 MG tablet Take 1 tablet (50 mg total) by mouth daily.   [DISCONTINUED] meclizine  (ANTIVERT ) 25 MG tablet Take 1 tablet (25 mg total) by mouth 3 (three) times daily as needed for dizziness.   No facility-administered medications prior to visit.    Review of Systems  Constitutional:  Positive for fatigue. Negative for fever.  Respiratory:  Negative for cough and shortness of breath.   Cardiovascular:  Positive for palpitations. Negative for chest pain and leg swelling.  Gastrointestinal:  Positive for constipation, nausea and vomiting. Negative for abdominal pain.  Neurological:  Positive for dizziness, light-headedness and headaches.       Objective    BP 136/78   Pulse 76   Ht 5\' 7"  (1.702 m)   Wt 214 lb 12.8 oz (97.4 kg)   BMI 33.64 kg/m    Physical Exam Constitutional:  General: She is awake.     Appearance: She is well-developed.  HENT:     Head: Normocephalic.  Eyes:     Conjunctiva/sclera: Conjunctivae normal.  Cardiovascular:     Rate and Rhythm: Normal rate and regular rhythm.     Heart sounds: Normal heart sounds.  Pulmonary:     Effort: Pulmonary effort is normal.  Skin:    General: Skin is warm.  Neurological:     Mental Status: She is alert and oriented to person, place, and time.  Psychiatric:         Attention and Perception: Attention normal.        Mood and Affect: Mood normal.        Speech: Speech normal.        Behavior: Behavior is cooperative.     Results for orders placed or performed in visit on 08/14/23  CBC w/Diff  Result Value Ref Range   WBC 6.4 3.8 - 10.8 Thousand/uL   RBC 4.11 3.80 - 5.10 Million/uL   Hemoglobin 8.6 (L) 11.7 - 15.5 g/dL   HCT 16.1 (L) 09.6 - 04.5 %   MCV 73.7 (L) 80.0 - 100.0 fL   MCH 20.9 (L) 27.0 - 33.0 pg   MCHC 28.4 (L) 32.0 - 36.0 g/dL   RDW 40.9 (H) 81.1 - 91.4 %   Platelets 463 (H) 140 - 400 Thousand/uL   MPV 10.0 7.5 - 12.5 fL   Neutro Abs 3,533 1,500 - 7,800 cells/uL   Absolute Lymphocytes 2,144 850 - 3,900 cells/uL   Absolute Monocytes 563 200 - 950 cells/uL   Eosinophils Absolute 109 15 - 500 cells/uL   Basophils Absolute 51 0 - 200 cells/uL   Neutrophils Relative % 55.2 %   Total Lymphocyte 33.5 %   Monocytes Relative 8.8 %   Eosinophils Relative 1.7 %   Basophils Relative 0.8 %  Comp Met (CMET)  Result Value Ref Range   Glucose, Bld 92 65 - 99 mg/dL   BUN 9 7 - 25 mg/dL   Creat 7.82 (H) 9.56 - 1.03 mg/dL   eGFR 63 > OR = 60 OZ/HYQ/6.57Q4   BUN/Creatinine Ratio 9 6 - 22 (calc)   Sodium 140 135 - 146 mmol/L   Potassium 4.0 3.5 - 5.3 mmol/L   Chloride 106 98 - 110 mmol/L   CO2 24 20 - 32 mmol/L   Calcium 9.2 8.6 - 10.4 mg/dL   Total Protein 6.5 6.1 - 8.1 g/dL   Albumin 4.0 3.6 - 5.1 g/dL   Globulin 2.5 1.9 - 3.7 g/dL (calc)   AG Ratio 1.6 1.0 - 2.5 (calc)   Total Bilirubin 0.2 0.2 - 1.2 mg/dL   Alkaline phosphatase (APISO) 80 37 - 153 U/L   AST 17 10 - 35 U/L   ALT 11 6 - 29 U/L  Iron , TIBC and Ferritin Panel  Result Value Ref Range   Iron  20 (L) 45 - 160 mcg/dL   TIBC 696 295 - 284 mcg/dL (calc)   %SAT 5 (L) 16 - 45 % (calc)   Ferritin 2 (L) 16 - 232 ng/mL  TSH  Result Value Ref Range   TSH 1.42 mIU/L  FSH  Result Value Ref Range   FSH 4.2 mIU/mL  B-HCG Quant  Result Value Ref Range   HCG, Total, QN <5  mIU/mL    Assessment & Plan    Iron  deficiency anemia, unspecified iron  deficiency anemia type Dizziness Assessment & Plan: Last seen for in 10/24.  Pt not taking iron  supplement and still having reportedly severe symptoms.  Pt has not been having regular menstrual cycles. Denies blood in stool.  Advised that anemia is concerning without a known cause. Reviewed that the anemia likely is contributing to all her symptoms . Last visit I had ordered a heart monitor 2/2 palpitations. She did not complete. We will hold off unless palpitations continue after anemia is managed.   Repeat cbc, iron . Plan to ref to heme d/t intolerance of oral iron . Plan to refer to GI as no known case of IDA.   Pt reports the meclizine  helped before with dizziness. Keeping vertigo/bppv on the ddx. Orders: -     Comprehensive metabolic panel with GFR -     Iron , TIBC and Ferritin Panel -     CBC with Differential/Platelet -     TSH -     Meclizine  HCl; Take 1 tablet (12.5 mg total) by mouth 3 (three) times daily as needed for dizziness.  Dispense: 30 tablet; Refill: 0  Amenorrhea Will check fsh for menopause status, reviewed menopause is 1 year w/o cycle. Checking hcg r/o pregnancy -     Follicle stimulating hormone -     hCG, quantitative, pregnancy  Severe episode of recurrent major depressive disorder, with psychotic features (HCC) GAD (generalized anxiety disorder) Assessment & Plan: Anxiety and depression were previously managed with Cymbalta  and Wellbutrin , but she discontinued them due to pharmacy issues.She is interested in continuing care with us  and open to psychiatric referral.  Refilling cymbalta  30 mg and wellbutrin  300 mg with recommendations to take 1/2 tab wellbutrin  for 1 week and then increase to 300 mg full tab.  F/b 2 weeks.   Orders: -     buPROPion  HCl ER (XL); Take 1 tablet (300 mg total) by mouth daily.  Dispense: 90 tablet; Refill: 0 -     DULoxetine  HCl; Take 1 capsule (30 mg total)  by mouth daily.  Dispense: 90 capsule; Refill: 0 -     Ambulatory referral to Psychiatry  Obesity (BMI 30-39.9) Assessment & Plan: Currently managed on wegovy 0.25 mg weekly.  Ok to continue, after 4 doses will increase to 0.5 mg weekly.    Reviewed with patient that she is not able to continue with two primary care providers. Explained the difference between pcp level care and specialty. Pt did not seem to understand. Initially I advised that, since she was being seen today, I could treat her acute issues, and she could reach out about a decision of primary care provider.   She states she will stick with us  at [place].  Advised pt I will look out for additional visits/prescriptions from other pcps. I cannot see Bethany's notes without a formal request, and it would be dangerous to her health if I provided care not knowing other prescriptions/plan of care. She will be discharged if she continues to see two PCPs.   Return in about 2 weeks (around 08/28/2023) for anxiety, depression, weight Management, anemia.       Trenton Frock, PA-C  [facility] Primary Care at [facility] [phone] (phone) [phone] (fax)  [facility]

## 2023-08-15 LAB — IRON,TIBC AND FERRITIN PANEL
%SAT: 5 % — ABNORMAL LOW (ref 16–45)
Ferritin: 2 ng/mL — ABNORMAL LOW (ref 16–232)
Iron: 20 ug/dL — ABNORMAL LOW (ref 45–160)
TIBC: 407 ug/dL (ref 250–450)

## 2023-08-15 LAB — COMPREHENSIVE METABOLIC PANEL WITH GFR
AG Ratio: 1.6 (calc) (ref 1.0–2.5)
ALT: 11 U/L (ref 6–29)
AST: 17 U/L (ref 10–35)
Albumin: 4 g/dL (ref 3.6–5.1)
Alkaline phosphatase (APISO): 80 U/L (ref 37–153)
BUN/Creatinine Ratio: 9 (calc) (ref 6–22)
BUN: 9 mg/dL (ref 7–25)
CO2: 24 mmol/L (ref 20–32)
Calcium: 9.2 mg/dL (ref 8.6–10.4)
Chloride: 106 mmol/L (ref 98–110)
Creat: 1.05 mg/dL — ABNORMAL HIGH (ref 0.50–1.03)
Globulin: 2.5 g/dL (ref 1.9–3.7)
Glucose, Bld: 92 mg/dL (ref 65–99)
Potassium: 4 mmol/L (ref 3.5–5.3)
Sodium: 140 mmol/L (ref 135–146)
Total Bilirubin: 0.2 mg/dL (ref 0.2–1.2)
Total Protein: 6.5 g/dL (ref 6.1–8.1)
eGFR: 63 mL/min/{1.73_m2} (ref >=60)

## 2023-08-15 LAB — TSH: TSH: 1.42 m[IU]/L

## 2023-08-15 LAB — CBC WITH DIFFERENTIAL/PLATELET
Absolute Lymphocytes: 2144 {cells}/uL (ref 850–3900)
Absolute Monocytes: 563 {cells}/uL (ref 200–950)
Basophils Absolute: 51 {cells}/uL (ref 0–200)
Basophils Relative: 0.8 %
Eosinophils Absolute: 109 {cells}/uL (ref 15–500)
Eosinophils Relative: 1.7 %
HCT: 30.3 % — ABNORMAL LOW (ref 35.0–45.0)
Hemoglobin: 8.6 g/dL — ABNORMAL LOW (ref 11.7–15.5)
MCH: 20.9 pg — ABNORMAL LOW (ref 27.0–33.0)
MCHC: 28.4 g/dL — ABNORMAL LOW (ref 32.0–36.0)
MCV: 73.7 fL — ABNORMAL LOW (ref 80.0–100.0)
MPV: 10 fL (ref 7.5–12.5)
Monocytes Relative: 8.8 %
Neutro Abs: 3533 {cells}/uL (ref 1500–7800)
Neutrophils Relative %: 55.2 %
Platelets: 463 10*3/uL — ABNORMAL HIGH (ref 140–400)
RBC: 4.11 10*6/uL (ref 3.80–5.10)
RDW: 17.8 % — ABNORMAL HIGH (ref 11.0–15.0)
Total Lymphocyte: 33.5 %
WBC: 6.4 10*3/uL (ref 3.8–10.8)

## 2023-08-15 LAB — FOLLICLE STIMULATING HORMONE: FSH: 4.2 m[IU]/mL

## 2023-08-15 LAB — HCG, QUANTITATIVE, PREGNANCY: HCG, Total, QN: <5 m[IU]/mL

## 2023-08-17 ENCOUNTER — Encounter: Payer: Self-pay | Admitting: Physician Assistant

## 2023-08-17 ENCOUNTER — Ambulatory Visit: Payer: Self-pay | Admitting: Physician Assistant

## 2023-08-17 DIAGNOSIS — E669 Obesity, unspecified: Secondary | ICD-10-CM | POA: Insufficient documentation

## 2023-08-17 DIAGNOSIS — D509 Iron deficiency anemia, unspecified: Secondary | ICD-10-CM

## 2023-08-17 DIAGNOSIS — Z1211 Encounter for screening for malignant neoplasm of colon: Secondary | ICD-10-CM

## 2023-08-17 NOTE — Assessment & Plan Note (Signed)
 Currently managed on wegovy 0.25 mg weekly.  Ok to continue, after 4 doses will increase to 0.5 mg weekly.

## 2023-08-17 NOTE — Assessment & Plan Note (Signed)
 Anxiety and depression were previously managed with Cymbalta  and Wellbutrin , but she discontinued them due to pharmacy issues.She is interested in continuing care with us  and open to psychiatric referral.  Refilling cymbalta  30 mg and wellbutrin  300 mg with recommendations to take 1/2 tab wellbutrin  for 1 week and then increase to 300 mg full tab.  F/b 2 weeks.

## 2023-08-17 NOTE — Assessment & Plan Note (Signed)
 Last seen for in 10/24. Pt not taking iron  supplement and still having reportedly severe symptoms.  Pt has not been having regular menstrual cycles. Denies blood in stool.  Advised that anemia is concerning without a known cause. Reviewed that the anemia likely is contributing to all her symptoms . Last visit I had ordered a heart monitor 2/2 palpitations. She did not complete. We will hold off unless palpitations continue after anemia is managed.   Repeat cbc, iron . Plan to ref to heme d/t intolerance of oral iron . Plan to refer to GI as no known case of IDA.

## 2023-08-18 ENCOUNTER — Other Ambulatory Visit: Payer: Self-pay | Admitting: Physician Assistant

## 2023-08-18 DIAGNOSIS — D509 Iron deficiency anemia, unspecified: Secondary | ICD-10-CM

## 2023-08-18 MED ORDER — ACCRUFER 30 MG PO CAPS: 30.0000 mg | ORAL_CAPSULE | Freq: Every day | ORAL | 0 refills | Status: DC

## 2023-08-18 NOTE — Telephone Encounter (Signed)
 Copied from CRM [id number]. Topic: Clinical - Medication Question >> Aug 18, 2023 10:27 AM [place] A wrote: Reason for CRM: Patient would like a different Iron  Supplement instead of Iron , Ferrous Sulfate , 325 (65 Fe) MG TABS-please contact

## 2023-08-26 ENCOUNTER — Ambulatory Visit: Admitting: Physician Assistant

## 2023-08-27 ENCOUNTER — Encounter: Payer: Self-pay | Admitting: Hematology & Oncology

## 2023-09-01 ENCOUNTER — Encounter: Payer: Self-pay | Admitting: Physician Assistant

## 2023-09-07 ENCOUNTER — Ambulatory Visit: Admitting: Physician Assistant

## 2023-09-07 ENCOUNTER — Encounter: Payer: Self-pay | Admitting: *Deleted

## 2023-09-27 NOTE — Progress Notes (Deleted)
 Ellouise Console, PA-C [address] [place], [place]  [address] Phone: [phone]   Gastroenterology Consultation  Referring Provider:     Cyndi Shaver, PA-C Primary Care Physician:  Cyndi Shaver, PA-C Primary Gastroenterologist:  Ellouise Console, PA-C / *** Reason for Consultation:     Iron  deficiency anemia        HPI:   Cheryl Wiggins is a 53 y.o. y/o female referred for consultation & management  by Cyndi Shaver, PA-C.    New patient.  She saw her PCP in June for follow-up of iron  deficiency anemia.  Has constipation with iron  supplements therefore does not take iron  regularly.  History of uterine fibroids.  No menstrual cycle since April 2025.  She sees [facility].  Has also seen providers at [facility].  PCP referred her to hematology for IV iron  infusion.  She has taken a lot of ibuprofen in the past.  11/2022:  Hgb 9.4, MCV 76, iron  25, ferritin 4.9, normal vitamin B12 (310), normal folate. 08/2023:  Hgb 8.6, MCV 73, iron  20, iron  saturation 5%, ferritin 2  No previous GI evaluation, EGD, or colonoscopy. No family history of colon, esophageal, stomach, or GI cancers.  Current symptoms: Takes omeprazole 20 Mg daily for GERD.  PMH anxiety and depression.  On Wegovy for weight loss.  Past Medical History:  Diagnosis Date   Hypertension     Past Surgical History:  Procedure Laterality Date   BREAST BIOPSY Right 10/27/2016   benign    Prior to Admission medications   Medication Sig Start Date End Date Taking? Authorizing Provider  buPROPion  (WELLBUTRIN  XL) 300 MG 24 hr tablet Take 1 tablet (300 mg total) by mouth daily. 08/14/23   Cyndi Shaver, PA-C  DULoxetine  (CYMBALTA ) 30 MG capsule Take 1 capsule (30 mg total) by mouth daily. 08/14/23   Cyndi Shaver, PA-C  Ferric Maltol  (ACCRUFER ) 30 MG CAPS Take 1 capsule (30 mg total) by mouth daily. 08/18/23   Cyndi Shaver, PA-C  losartan -hydrochlorothiazide  (HYZAAR ) 50-12.5 MG tablet Take 1 tablet by  mouth daily. 06/09/23     meclizine  (ANTIVERT ) 12.5 MG tablet Take 1 tablet (12.5 mg total) by mouth 3 (three) times daily as needed for dizziness. 08/14/23   Cyndi Shaver, PA-C  omeprazole (PRILOSEC) 20 MG capsule Take 20 mg by mouth daily. 08/29/22   [provider]  Semaglutide-Weight Management (WEGOVY) 0.25 MG/0.5ML SOAJ Inject 0.25 mg into the skin once a week.    [provider]  valACYclovir (VALTREX) 500 MG tablet Take 500 mg by mouth daily. 10/06/22   [provider]    Family History  Problem Relation Age of Onset   Breast cancer Mother 75     Social History   Tobacco Use   Smoking status: Never   Smokeless tobacco: Never  Substance Use Topics   Alcohol  use: No    Allergies as of 09/28/2023   (No Known Allergies)    Review of Systems:    All systems reviewed and negative except where noted in HPI.   Physical Exam:  There were no vitals taken for this visit. No LMP recorded.  General:   Alert,  Well-developed, well-nourished, pleasant and cooperative in NAD Lungs:  Respirations even and unlabored.  Clear throughout to auscultation.   No wheezes, crackles, or rhonchi. No acute distress. Heart:  Regular rate and rhythm; no murmurs, clicks, rubs, or gallops. Abdomen:  Normal bowel sounds.  No bruits.  Soft, and non-distended without  masses, hepatosplenomegaly or hernias noted.  No Tenderness.  No guarding or rebound tenderness.    Neurologic:  Alert and oriented x3;  grossly normal neurologically. Psych:  Alert and cooperative. Normal mood and affect.  Imaging Studies: No results found.  Labs: CBC    Component Value Date/Time   WBC 6.4 08/14/2023 1637   RBC 4.11 08/14/2023 1637   HGB 8.6 (L) 08/14/2023 1637   HCT 30.3 (L) 08/14/2023 1637   PLT 463 (H) 08/14/2023 1637   MCV 73.7 (L) 08/14/2023 1637   MCH 20.9 (L) 08/14/2023 1637   MCHC 28.4 (L) 08/14/2023 1637   RDW 17.8 (H) 08/14/2023 1637   LYMPHSABS 2.1 12/01/2022 0948   MONOABS  0.5 12/01/2022 0948   EOSABS 109 08/14/2023 1637   BASOSABS 51 08/14/2023 1637    CMP     Component Value Date/Time   NA 140 08/14/2023 1637   K 4.0 08/14/2023 1637   CL 106 08/14/2023 1637   CO2 24 08/14/2023 1637   GLUCOSE 92 08/14/2023 1637   BUN 9 08/14/2023 1637   CREATININE 1.05 (H) 08/14/2023 1637   CALCIUM 9.2 08/14/2023 1637   PROT 6.5 08/14/2023 1637   ALBUMIN 4.0 12/01/2022 0948   AST 17 08/14/2023 1637   ALT 11 08/14/2023 1637   ALKPHOS 64 12/01/2022 0948   BILITOT 0.2 08/14/2023 1637   GFRNONAA >60 01/28/2017 0949   GFRAA >60 01/28/2017 0949    Assessment and Plan:   Cheryl Wiggins is a 53 y.o. y/o female has been referred for   Iron  deficiency anemia - Labs: CBC, iron  panel, ferritin, and celiac lab.  - Scheduling EGD & Colonoscopy I discussed risks of EGD and colonoscopy with patient to include risk of bleeding, perforation, and risk of sedation.  Patient expressed understanding and agrees to proceed with procedures.   - IV iron  infusion if she cannot tolerate oral iron  - Consider capsule endoscopy if EGD and colonoscopy are unrevealing  Colon cancer screening: She has never had a colonoscopy - Scheduling Colonoscopy I discussed risks of colonoscopy with patient to include risk of bleeding, colon perforation, and risk of sedation.  Patient expressed understanding and agrees to proceed with colonoscopy.    Follow up ***  Ellouise Console, PA-C

## 2023-09-28 ENCOUNTER — Ambulatory Visit: Admitting: Physician Assistant

## 2023-12-16 ENCOUNTER — Ambulatory Visit: Payer: Self-pay

## 2023-12-16 ENCOUNTER — Other Ambulatory Visit: Payer: Self-pay | Admitting: Family Medicine

## 2023-12-16 DIAGNOSIS — F333 Major depressive disorder, recurrent, severe with psychotic symptoms: Secondary | ICD-10-CM

## 2023-12-16 DIAGNOSIS — F411 Generalized anxiety disorder: Secondary | ICD-10-CM

## 2023-12-16 MED ORDER — BUPROPION HCL ER (XL) 300 MG PO TB24: 300.0000 mg | ORAL_TABLET | Freq: Every day | ORAL | 0 refills | Status: DC

## 2023-12-16 MED ORDER — VALACYCLOVIR HCL 500 MG PO TABS: 500.0000 mg | ORAL_TABLET | Freq: Every day | ORAL | 1 refills | Status: AC

## 2023-12-16 MED ORDER — DULOXETINE HCL 30 MG PO CPEP: 30.0000 mg | ORAL_CAPSULE | Freq: Every day | ORAL | 0 refills | Status: DC

## 2023-12-16 MED ORDER — LOSARTAN POTASSIUM-HCTZ 50-12.5 MG PO TABS: 1.0000 | ORAL_TABLET | Freq: Every day | ORAL | 0 refills | Status: DC

## 2023-12-16 NOTE — Telephone Encounter (Unsigned)
 Copied from CRM #[id number]. Topic: Clinical - Medication Refill >> Dec 16, 2023  4:23 PM Shereese L wrote: Medication: buPROPion  (WELLBUTRIN  XL) 300 MG 24 hr tablet losartan -hydrochlorothiazide  (HYZAAR ) 50-12.5 MG tablet DULoxetine  (CYMBALTA ) 30 MG capsule valACYclovir (VALTREX) 500 MG tablet      Has the patient contacted their pharmacy? Yes (Agent: If no, request that the patient contact the pharmacy for the refill. If patient does not wish to contact the pharmacy document the reason why and proceed with request.) (Agent: If yes, when and what did the pharmacy advise?)  This is the patient's preferred pharmacy:  [facility] [address] - [place], [place] - [address] [place] [address] Phone: [phone] Fax: [phone]  Is this the correct pharmacy for this prescription? Yes If no, delete pharmacy and type the correct one.   Has the prescription been filled recently? Yes  Is the patient out of the medication? Yes  Has the patient been seen for an appointment in the last year OR does the patient have an upcoming appointment? Yes  Can we respond through MyChart? Yes  Agent: Please be advised that Rx refills may take up to 3 business days. We ask that you follow-up with your pharmacy.

## 2023-12-16 NOTE — Telephone Encounter (Signed)
 This RN attempted to contact patient (3rd attempt) - no answer. Left voicemail. Will forward to clinic for follow up.

## 2023-12-16 NOTE — Telephone Encounter (Signed)
 Patient disconnected upon transfer to NT, attempted to call patient back and went straight to voicemail, left message with call back number.  Copied from CRM #[id number]. Topic: Clinical - Red Word Triage >> Dec 16, 2023  4:26 PM Shereese L wrote: [facility] that prompted transfer to Nurse Triage: Patient stated that she's extreme stress and anxiety

## 2024-03-18 ENCOUNTER — Ambulatory Visit: Admitting: Family Medicine

## 2024-03-18 ENCOUNTER — Encounter: Payer: Self-pay | Admitting: Family Medicine

## 2024-03-18 VITALS — BP 141/75 | HR 63 | Ht 67.0 in | Wt 212.0 lb

## 2024-03-18 DIAGNOSIS — R5383 Other fatigue: Secondary | ICD-10-CM

## 2024-03-18 DIAGNOSIS — Z114 Encounter for screening for human immunodeficiency virus [HIV]: Secondary | ICD-10-CM

## 2024-03-18 DIAGNOSIS — F411 Generalized anxiety disorder: Secondary | ICD-10-CM | POA: Diagnosis not present

## 2024-03-18 DIAGNOSIS — F333 Major depressive disorder, recurrent, severe with psychotic symptoms: Secondary | ICD-10-CM

## 2024-03-18 DIAGNOSIS — D509 Iron deficiency anemia, unspecified: Secondary | ICD-10-CM

## 2024-03-18 DIAGNOSIS — Z1211 Encounter for screening for malignant neoplasm of colon: Secondary | ICD-10-CM

## 2024-03-18 DIAGNOSIS — I10 Essential (primary) hypertension: Secondary | ICD-10-CM | POA: Diagnosis not present

## 2024-03-18 DIAGNOSIS — Z1159 Encounter for screening for other viral diseases: Secondary | ICD-10-CM

## 2024-03-18 DIAGNOSIS — Z0000 Encounter for general adult medical examination without abnormal findings: Secondary | ICD-10-CM

## 2024-03-18 DIAGNOSIS — Z1231 Encounter for screening mammogram for malignant neoplasm of breast: Secondary | ICD-10-CM

## 2024-03-18 LAB — CBC WITH DIFFERENTIAL/PLATELET
Basophils Absolute: 0 K/uL (ref 0.0–0.1)
Basophils Relative: 0.5 % (ref 0.0–3.0)
Eosinophils Absolute: 0.1 K/uL (ref 0.0–0.7)
Eosinophils Relative: 1.9 % (ref 0.0–5.0)
HCT: 34.6 % — ABNORMAL LOW (ref 36.0–46.0)
Hemoglobin: 11.1 g/dL — ABNORMAL LOW (ref 12.0–15.0)
Lymphocytes Relative: 32.7 % (ref 12.0–46.0)
Lymphs Abs: 2 K/uL (ref 0.7–4.0)
MCHC: 31.9 g/dL (ref 30.0–36.0)
MCV: 75.6 fl — ABNORMAL LOW (ref 78.0–100.0)
Monocytes Absolute: 0.4 K/uL (ref 0.1–1.0)
Monocytes Relative: 7.1 % (ref 3.0–12.0)
Neutro Abs: 3.5 K/uL (ref 1.4–7.7)
Neutrophils Relative %: 57.8 % (ref 43.0–77.0)
Platelets: 439 K/uL — ABNORMAL HIGH (ref 150.0–400.0)
RBC: 4.58 Mil/uL (ref 3.87–5.11)
RDW: 18.2 % — ABNORMAL HIGH (ref 11.5–15.5)
WBC: 6 K/uL (ref 4.0–10.5)

## 2024-03-18 LAB — LIPID PANEL
Cholesterol: 180 mg/dL (ref 28–200)
HDL: 52.9 mg/dL
LDL Cholesterol: 113 mg/dL — ABNORMAL HIGH (ref 10–99)
NonHDL: 126.91
Total CHOL/HDL Ratio: 3
Triglycerides: 68 mg/dL (ref 10.0–149.0)
VLDL: 13.6 mg/dL (ref 0.0–40.0)

## 2024-03-18 LAB — COMPREHENSIVE METABOLIC PANEL WITH GFR
ALT: 10 U/L (ref 3–35)
AST: 16 U/L (ref 5–37)
Albumin: 4.4 g/dL (ref 3.5–5.2)
Alkaline Phosphatase: 83 U/L (ref 39–117)
BUN: 8 mg/dL (ref 6–23)
CO2: 31 meq/L (ref 19–32)
Calcium: 9.6 mg/dL (ref 8.4–10.5)
Chloride: 105 meq/L (ref 96–112)
Creatinine, Ser: 0.95 mg/dL (ref 0.40–1.20)
GFR: 67.33 mL/min
Glucose, Bld: 85 mg/dL (ref 70–99)
Potassium: 4.2 meq/L (ref 3.5–5.1)
Sodium: 141 meq/L (ref 135–145)
Total Bilirubin: 0.4 mg/dL (ref 0.2–1.2)
Total Protein: 6.8 g/dL (ref 6.0–8.3)

## 2024-03-18 LAB — B12 AND FOLATE PANEL
Folate: 11.4 ng/mL
Vitamin B-12: 294 pg/mL (ref 211–911)

## 2024-03-18 LAB — IBC + FERRITIN
Ferritin: 4.2 ng/mL — ABNORMAL LOW (ref 10.0–291.0)
Iron: 62 ug/dL (ref 42–145)
Saturation Ratios: 13.7 % — ABNORMAL LOW (ref 20.0–50.0)
TIBC: 453.6 ug/dL — ABNORMAL HIGH (ref 250.0–450.0)
Transferrin: 324 mg/dL (ref 212.0–360.0)

## 2024-03-18 LAB — HIV ANTIBODY (ROUTINE TESTING W REFLEX)
HIV 1&2 Ab, 4th Generation: NONREACTIVE
HIV FINAL INTERPRETATION: NEGATIVE

## 2024-03-18 LAB — HEPATITIS C ANTIBODY: Hepatitis C Ab: NONREACTIVE

## 2024-03-18 LAB — TSH: TSH: 1.36 u[IU]/mL (ref 0.35–5.50)

## 2024-03-18 MED ORDER — BUPROPION HCL ER (XL) 300 MG PO TB24: 300.0000 mg | ORAL_TABLET | Freq: Every day | ORAL | 0 refills | Status: AC

## 2024-03-18 MED ORDER — DULOXETINE HCL 30 MG PO CPEP: 30.0000 mg | ORAL_CAPSULE | Freq: Every day | ORAL | 0 refills | Status: AC

## 2024-03-18 MED ORDER — LOSARTAN POTASSIUM-HCTZ 50-12.5 MG PO TABS: 1.0000 | ORAL_TABLET | Freq: Every day | ORAL | 0 refills | Status: AC

## 2024-03-18 NOTE — Progress Notes (Signed)
 "  New Patient Office Visit  Subjective    Patient ID: Cheryl Wiggins, female    DOB: 1970-05-03  Age: 54 y.o. MRN: [id number]  CC:  Chief Complaint  Patient presents with   Establish Care    HPI Emalene Wiggins presents to establish/transfer care. She works at [employer]. She lives with her son, daughter, and grandchildren.  Discussed the use of AI scribe software for clinical note transcription with the patient, who gave verbal consent to proceed.  History of Present Illness Cheryl Wiggins is a 54 year old female with hypertension who presents for medication refills and management of high blood pressure.  She has run out of her medications, including losartan  hydrochlorothiazide , Wellbutrin , and Cymbalta . She has been experiencing elevated blood pressure readings at home, with a recent measurement of 168/86 in the office. She mentions experiencing floaters in her vision, described as 'like a snow globe that shakes up.' She reports that her eye doctor recommended that she control her blood pressure.  She has a history of low iron  levels, previously noted by another provider, but has not followed up with hematology or GI. She associates her symptoms with menopause, as her last menstrual period was in early 2025. She feels extremely tired, which she attributes to her low iron  levels. She has tried iron  supplements but experienced constipation with one type and found another type tolerable but requiring twice-daily dosing. She has not been consistent with taking the supplements due to side effects and inconvenience.  She has also run out of her mood medications, Wellbutrin  300 mg and Cymbalta  30 mg, which she reports helped her focus. She notices a significant difference in her ability to concentrate without them. No thoughts of self-harm or side effects from these medications.  In terms of her social history, she works at [employer] and lives with her children and grandchildren. She  has been focusing on work and family, impacting her ability to attend medical appointments. She reports a lack of consistent exercise due to fatigue but engages in some physical activity with her grandson. Her diet is not as healthy as she would like, and she has been drinking beet juice to help manage her blood pressure, which she finds 'disgusting' but somewhat effective.     Hypertension: - Medications: Losartan -HCTZ 50-12.5 mg daily.  - Compliance: poor, ran out over a month ago  - Checking BP at home: high - Denies any SOB, recurrent headaches, CP, LE edema, dizziness, palpitations, or medication side effects. She has had some floaters - following with eye doctor.  - Diet: general - Exercise: none   Depression/Anxiety: - Treatment: Bupropion  300 mg daily and Duloxetine  30 mg daily. - Medication side effects: none - SI/HI: no - Update: Ran out of meds about a month ago, was doing well while on medicine.    Anemia: - Management: no current supplement - She was previously referred to hematology and GI, but did not follow-up.  - Having significant fatigue      03/18/2024   12:23 PM 08/14/2023    4:38 PM 12/09/2022    8:12 AM  PHQ9 SCORE ONLY  PHQ-9 Total Score 22 24  0      Data saved with a previous flowsheet row definition      03/18/2024   12:24 PM 08/14/2023    4:39 PM  GAD 7 : Generalized Anxiety Score  Nervous, Anxious, on Edge 3 3  Control/stop worrying 3 3  Worry too much -  different things 3 3  Trouble relaxing 2 3  Restless 3 3  Easily annoyed or irritable 3 3  Afraid - awful might happen 2 3  Total GAD 7 Score 19 21  Anxiety Difficulty Extremely difficult Very difficult        Outpatient Encounter Medications as of 03/18/2024  Medication Sig   valACYclovir  (VALTREX ) 500 MG tablet Take 1 tablet (500 mg total) by mouth daily.   [DISCONTINUED] buPROPion  (WELLBUTRIN  XL) 300 MG 24 hr tablet Take 1 tablet (300 mg total) by mouth daily.   [DISCONTINUED]  DULoxetine  (CYMBALTA ) 30 MG capsule Take 1 capsule (30 mg total) by mouth daily.   [DISCONTINUED] losartan -hydrochlorothiazide  (HYZAAR ) 50-12.5 MG tablet Take 1 tablet by mouth daily.   [DISCONTINUED] norethindrone (MICRONOR) 0.35 MG tablet Take 1 tablet by mouth daily.   [DISCONTINUED] omeprazole (PRILOSEC) 20 MG capsule Take 20 mg by mouth daily.   buPROPion  (WELLBUTRIN  XL) 300 MG 24 hr tablet Take 1 tablet (300 mg total) by mouth daily.   DULoxetine  (CYMBALTA ) 30 MG capsule Take 1 capsule (30 mg total) by mouth daily.   losartan -hydrochlorothiazide  (HYZAAR ) 50-12.5 MG tablet Take 1 tablet by mouth daily.   [DISCONTINUED] Ferric Maltol  (ACCRUFER ) 30 MG CAPS Take 1 capsule (30 mg total) by mouth daily.   [DISCONTINUED] meclizine  (ANTIVERT ) 12.5 MG tablet Take 1 tablet (12.5 mg total) by mouth 3 (three) times daily as needed for dizziness.   [DISCONTINUED] Semaglutide-Weight Management (WEGOVY) 0.25 MG/0.5ML SOAJ Inject 0.25 mg into the skin once a week.   No facility-administered encounter medications on file as of 03/18/2024.    Past Medical History:  Diagnosis Date   Depression    GAD (generalized anxiety disorder)    Hypertension    IDA (iron  deficiency anemia)     Past Surgical History:  Procedure Laterality Date   BREAST BIOPSY Right 10/27/2016   benign    Family History  Problem Relation Age of Onset   Breast cancer Mother 25    Social History   Socioeconomic History   Marital status: Single    Spouse name: Not on file   Number of children: Not on file   Years of education: Not on file   Highest education level: Not on file  Occupational History   Not on file  Tobacco Use   Smoking status: Never   Smokeless tobacco: Never  Substance and Sexual Activity   Alcohol  use: No   Drug use: Not on file   Sexual activity: Never  Other Topics Concern   Not on file  Social History Narrative   Not on file   Social Drivers of Health   Tobacco Use: Low Risk (03/18/2024)    Patient History    Smoking Tobacco Use: Never    Smokeless Tobacco Use: Never    Passive Exposure: Not on file  Financial Resource Strain: Not on file  Food Insecurity: Not on file  Transportation Needs: Not on file  Physical Activity: Not on file  Stress: Not on file  Social Connections: Not on file  Intimate Partner Violence: Not on file  Depression (PHQ2-9): High Risk (03/18/2024)   Depression (PHQ2-9)    PHQ-2 Score: 22  Alcohol  Screen: Not on file  Housing: Not on file  Utilities: Not on file  Health Literacy: Not on file    ROS All review of systems negative except what is listed in the HPI      Objective    BP (!) 141/75   Pulse 63  Ht 5' 7 (1.702 m)   Wt 212 lb (96.2 kg)   SpO2 100%   BMI 33.20 kg/m   Physical Exam Vitals reviewed.  Constitutional:      General: She is not in acute distress.    Appearance: Normal appearance. She is obese. She is not ill-appearing.  Cardiovascular:     Rate and Rhythm: Normal rate and regular rhythm.     Heart sounds: Normal heart sounds.  Pulmonary:     Effort: Pulmonary effort is normal.     Breath sounds: Normal breath sounds.  Musculoskeletal:     Cervical back: No tenderness.  Lymphadenopathy:     Cervical: No cervical adenopathy.  Skin:    General: Skin is warm and dry.  Neurological:     Mental Status: She is alert and oriented to person, place, and time.  Psychiatric:        Mood and Affect: Mood normal.        Behavior: Behavior normal.        Thought Content: Thought content normal.        Judgment: Judgment normal.             Assessment & Plan:   Problem List Items Addressed This Visit       Active Problems   GAD (generalized anxiety disorder)   See depression      Relevant Medications   buPROPion  (WELLBUTRIN  XL) 300 MG 24 hr tablet   DULoxetine  (CYMBALTA ) 30 MG capsule   Severe episode of recurrent major depressive disorder, with psychotic features (HCC)   Improvement with  Wellbutrin  and Cymbalta . No current medication due to prescription lapse. No suicidal ideation. PHQ9/GAD7 reviewed.  - Refills provided.       Relevant Medications   buPROPion  (WELLBUTRIN  XL) 300 MG 24 hr tablet   DULoxetine  (CYMBALTA ) 30 MG capsule   Primary hypertension - Primary   Blood pressure is not at goal for age and co-morbidities.   Recommendations: restart losartan benson; nurse visit recheck in 2 weeks - BP goal <130/80 - monitor and log blood pressures at home - check around the same time each day in a relaxed setting - Limit salt to <2000 mg/day - Follow DASH eating plan (heart healthy diet) - limit alcohol  to 2 standard drinks per day for men and 1 per day for women - avoid tobacco products - get at least 2 hours of regular aerobic exercise weekly Patient aware of signs/symptoms requiring further/urgent evaluation. Labs updated today.       Relevant Medications   losartan -hydrochlorothiazide  (HYZAAR ) 50-12.5 MG tablet   Other Relevant Orders   Lipid panel   IDA (iron  deficiency anemia)   No current supplement. Labs today       Relevant Orders   IBC + Ferritin   Ambulatory referral to Gastroenterology   Other Visit Diagnoses       Encounter for medical examination to establish care       Relevant Orders   Ambulatory referral to Obstetrics / Gynecology     Encounter for screening mammogram for malignant neoplasm of breast       Relevant Orders   MM 3D SCREENING MAMMOGRAM BILATERAL BREAST     Fatigue, unspecified type       Relevant Orders   IBC + Ferritin   CBC with Differential/Platelet   Comprehensive metabolic panel with GFR   TSH   B12 and Folate Panel     Encounter for screening for HIV  Relevant Orders   HIV Antibody (routine testing w rflx)     Encounter for hepatitis C screening test for low risk patient       Relevant Orders   Hepatitis C antibody     Screen for colon cancer       Relevant Orders   Ambulatory referral to  Gastroenterology        Return in about 2 weeks (around 04/01/2024) for BP check with nurse.   Waddell KATHEE Mon, NP   "

## 2024-03-18 NOTE — Assessment & Plan Note (Signed)
 Improvement with Wellbutrin  and Cymbalta . No current medication due to prescription lapse. No suicidal ideation. PHQ9/GAD7 reviewed.  - Refills provided.

## 2024-03-18 NOTE — Assessment & Plan Note (Signed)
"  See depression  "

## 2024-03-18 NOTE — Assessment & Plan Note (Signed)
No current supplement  Labs today

## 2024-03-18 NOTE — Assessment & Plan Note (Signed)
 Blood pressure is not at goal for age and co-morbidities.   Recommendations: restart losartan benson; nurse visit recheck in 2 weeks - BP goal <130/80 - monitor and log blood pressures at home - check around the same time each day in a relaxed setting - Limit salt to <2000 mg/day - Follow DASH eating plan (heart healthy diet) - limit alcohol  to 2 standard drinks per day for men and 1 per day for women - avoid tobacco products - get at least 2 hours of regular aerobic exercise weekly Patient aware of signs/symptoms requiring further/urgent evaluation. Labs updated today.

## 2024-03-22 ENCOUNTER — Ambulatory Visit: Payer: Self-pay

## 2024-03-22 ENCOUNTER — Telehealth: Payer: Self-pay | Admitting: Family Medicine

## 2024-03-22 NOTE — Telephone Encounter (Signed)
 Error CRM sent. Need more information- what referral?

## 2024-03-22 NOTE — Telephone Encounter (Signed)
 FYI Only or Action Required?: FYI only for provider: appointment scheduled on 03/24/2024.  Patient was last seen in primary care on 03/18/2024 by Almarie Waddell NOVAK, NP.  Called Nurse Triage reporting Vaginal Bleeding.  Symptoms began yesterday.   Triage Disposition: See PCP When Office is Open (Within 3 Days) (overriding See PCP Within 2 Weeks)  Patient/caregiver understands and will follow disposition?: Yes               Copied from CRM #[id number]. Topic: Clinical - Red Word Triage >> Mar 22, 2024  8:37 AM Deleta RAMAN wrote: Red Word that prompted transfer to Nurse Triage: pt passed blood clot on yesterday states she's pass the age for a menstrual and is concern Reason for Disposition  Postmenopausal vaginal bleeding  Answer Assessment - Initial Assessment Questions This RN scheduled pt for first available appointment with PCP on Thurs, 1/15. This RN educated pt on new-worsening symptoms and when to call back/seek emergent care. Pt verbalized understanding and agrees to plan.    Passed a vaginal blood clot yesterday (started as a little, 1-2 dark red blood clots last night, now light pink) Last menstrual cycle: Feb 2025 Pt started taking her BP med again on Fri; a little dizzy right now- has been having this for a week, feels same as normal (pt was not on her BP med for a month) Pt states she has anxiety about the vaginal bleeding as she is postmenopausal and has been reading about possible diagnoses online Did see eye doctor last week for floaters in vision x3 weeks; eye doctor said nothing was wrong  Denies abdominal pain, SOB  Protocols used: Vaginal Bleeding - Postmenopausal-A-AH

## 2024-03-22 NOTE — Telephone Encounter (Signed)
 Appt scheduled

## 2024-03-22 NOTE — Telephone Encounter (Signed)
 Copied from CRM #[id number]. Topic: Referral - Status >> Mar 22, 2024  8:32 AM Deleta RAMAN wrote: Reason for CRM: patient would like to know the status of her referral

## 2024-03-22 NOTE — Telephone Encounter (Signed)
 Patient called back, let her know GI and GYN referrals have been sent. She did not have anything to write down the numbers to contact them, will check in mychart. Will call back with any further questions.

## 2024-03-22 NOTE — Telephone Encounter (Signed)
 Tried to call patient back, phone picked up and disconnected. The referral team did send a message to patient letting them know where referral was sent and contact number.

## 2024-03-23 ENCOUNTER — Ambulatory Visit: Payer: Self-pay | Admitting: Family Medicine

## 2024-03-24 ENCOUNTER — Ambulatory Visit: Admitting: Family Medicine

## 2024-03-24 ENCOUNTER — Encounter: Payer: Self-pay | Admitting: Family Medicine

## 2024-03-24 ENCOUNTER — Other Ambulatory Visit (HOSPITAL_COMMUNITY)
Admission: RE | Admit: 2024-03-24 | Discharge: 2024-03-24 | Disposition: A | Discharge status: Home / Self Care | Source: Ambulatory Visit | Attending: Family Medicine | Admitting: Family Medicine

## 2024-03-24 VITALS — BP 129/72 | HR 76 | Ht 67.0 in | Wt 207.0 lb

## 2024-03-24 DIAGNOSIS — Z01411 Encounter for gynecological examination (general) (routine) with abnormal findings: Secondary | ICD-10-CM | POA: Diagnosis present

## 2024-03-24 DIAGNOSIS — N939 Abnormal uterine and vaginal bleeding, unspecified: Secondary | ICD-10-CM | POA: Insufficient documentation

## 2024-03-24 DIAGNOSIS — N898 Other specified noninflammatory disorders of vagina: Secondary | ICD-10-CM | POA: Diagnosis present

## 2024-03-24 LAB — CBC WITH DIFFERENTIAL/PLATELET
Basophils Absolute: 0 K/uL (ref 0.0–0.1)
Basophils Relative: 0.9 % (ref 0.0–3.0)
Eosinophils Absolute: 0.1 K/uL (ref 0.0–0.7)
Eosinophils Relative: 2.7 % (ref 0.0–5.0)
HCT: 34.9 % — ABNORMAL LOW (ref 36.0–46.0)
Hemoglobin: 11.3 g/dL — ABNORMAL LOW (ref 12.0–15.0)
Lymphocytes Relative: 32.1 % (ref 12.0–46.0)
Lymphs Abs: 1.3 K/uL (ref 0.7–4.0)
MCHC: 32.3 g/dL (ref 30.0–36.0)
MCV: 75.8 fl — ABNORMAL LOW (ref 78.0–100.0)
Monocytes Absolute: 0.3 K/uL (ref 0.1–1.0)
Monocytes Relative: 7.9 % (ref 3.0–12.0)
Neutro Abs: 2.4 K/uL (ref 1.4–7.7)
Neutrophils Relative %: 56.4 % (ref 43.0–77.0)
Platelets: 417 K/uL — ABNORMAL HIGH (ref 150.0–400.0)
RBC: 4.6 Mil/uL (ref 3.87–5.11)
RDW: 17.9 % — ABNORMAL HIGH (ref 11.5–15.5)
WBC: 4.2 K/uL (ref 4.0–10.5)

## 2024-03-24 LAB — COMPREHENSIVE METABOLIC PANEL WITH GFR
ALT: 10 U/L (ref 3–35)
AST: 16 U/L (ref 5–37)
Albumin: 4.3 g/dL (ref 3.5–5.2)
Alkaline Phosphatase: 79 U/L (ref 39–117)
BUN: 11 mg/dL (ref 6–23)
CO2: 31 meq/L (ref 19–32)
Calcium: 9.6 mg/dL (ref 8.4–10.5)
Chloride: 103 meq/L (ref 96–112)
Creatinine, Ser: 1.16 mg/dL (ref 0.40–1.20)
GFR: 52.97 mL/min — ABNORMAL LOW
Glucose, Bld: 84 mg/dL (ref 70–99)
Potassium: 3.8 meq/L (ref 3.5–5.1)
Sodium: 139 meq/L (ref 135–145)
Total Bilirubin: 0.4 mg/dL (ref 0.2–1.2)
Total Protein: 6.9 g/dL (ref 6.0–8.3)

## 2024-03-24 LAB — PROLACTIN: Prolactin: 10.6 ng/mL

## 2024-03-24 LAB — FOLLICLE STIMULATING HORMONE: FSH: 20.6 m[IU]/mL

## 2024-03-24 LAB — TSH: TSH: 0.82 u[IU]/mL (ref 0.35–5.50)

## 2024-03-24 LAB — ESTRADIOL: Estradiol: <30 pg/mL

## 2024-03-24 LAB — TESTOSTERONE: Testosterone: 15.74 ng/dL (ref 15.00–40.00)

## 2024-03-24 LAB — HIV ANTIBODY (ROUTINE TESTING W REFLEX)
HIV 1&2 Ab, 4th Generation: NONREACTIVE
HIV FINAL INTERPRETATION: NEGATIVE

## 2024-03-24 NOTE — Telephone Encounter (Signed)
 Cheryl Wiggins   03/23/2024  7:20 PM  Thank you for submitting this issue for investigation. After a thorough review, we have determined that an error was made by an E2C2 team member. We have addressed the matter directly with the agent involved. We appreciate you bringing this to our attention.   Error/Investigation Details:    CAL ID: [id number]  After reviewing the call associated with the reported error, it does appear that the error is valid. The patient was calling to check the status of her Gynecology and Gastroenterology referrals, both of which were placed on 03/18/2024. The specialist will receive coaching on chart review and the importance of ensuring that all documentation is clear and comprehensive to help prevent concerns like this in the future.  Thank you for your dedication to patient care and your willingness to submit this error for review.

## 2024-03-24 NOTE — Progress Notes (Signed)
 "  Acute Office Visit  Subjective:  Patient ID: Cheryl Wiggins, female    DOB: [birth year]  Age: 54 y.o. MRN: [id number]  CC:  Chief Complaint  Patient presents with   Vaginal Bleeding      HPI Cheryl Wiggins is here for menopausal bleeding. Her LMP was 04/2023. No abdominal pain or SOB.   Discussed the use of AI scribe software for clinical note transcription with the patient, who gave verbal consent to proceed.  History of Present Illness Cheryl Wiggins is a 53 year old female who presents with unexpected vaginal bleeding.  She has been experiencing unexpected vaginal bleeding, which began with the passage of a large red blood clot and dark red blood while on the toilet. The bleeding has since lightened, and she now observes traces of pink, primarily when urinating. There is no pain during urination, and the bleeding is not continuous, as it does not require a pad. Occasional discharge is noted but not enough to require a pad.  She has not had a true menstrual period since February 2025  marking nearly eleven months without menstruation. She is uncertain if the bleeding is vaginal or urinary in origin, as she notices it primarily when urinating. She reports that she is sexually active, her last sexual activity was a couple of weeks ago.  No urinary symptoms such as burning or itching are present. She is not experiencing dizziness or lightheadedness. She confirms the presence of vaginal discharge.        Past Medical History:  Diagnosis Date   Depression    GAD (generalized anxiety disorder)    Hypertension    IDA (iron  deficiency anemia)     Past Surgical History:  Procedure Laterality Date   BREAST BIOPSY Right 10/27/2016   benign    Family History  Problem Relation Age of Onset   Breast cancer Mother 48    Social History   Socioeconomic History   Marital status: Single    Spouse name: Not on file   Number of children: Not on file   Years of education: Not on  file   Highest education level: Not on file  Occupational History   Not on file  Tobacco Use   Smoking status: Never   Smokeless tobacco: Never  Substance and Sexual Activity   Alcohol  use: No   Drug use: Not on file   Sexual activity: Never  Other Topics Concern   Not on file  Social History Narrative   Not on file   Social Drivers of Health   Tobacco Use: Low Risk (03/24/2024)   Patient History    Smoking Tobacco Use: Never    Smokeless Tobacco Use: Never    Passive Exposure: Not on file  Financial Resource Strain: Not on file  Food Insecurity: Not on file  Transportation Needs: Not on file  Physical Activity: Not on file  Stress: Not on file  Social Connections: Not on file  Intimate Partner Violence: Not on file  Depression (PHQ2-9): High Risk (03/24/2024)   Depression (PHQ2-9)    PHQ-2 Score: 23  Alcohol  Screen: Not on file  Housing: Not on file  Utilities: Not on file  Health Literacy: Not on file    ROS All ROS negative except what is listed in the HPI.   Objective:   Today's Vitals: BP 129/72   Pulse 76   Ht 5' 7 (1.702 m)   Wt 207 lb (93.9 kg)   SpO2 100%   BMI 32.42  kg/m   Physical Exam Vitals reviewed. Exam conducted with a chaperone present.  Constitutional:      Appearance: Normal appearance.  Genitourinary:    General: Normal vulva.     Vagina: Vaginal discharge present.     Comments: Unable to fully open speculum and poor visualization related to significant amount of brown/milky, watery fluid filling the speculum and vaginal vault  Neurological:     Mental Status: She is alert and oriented to person, place, and time.  Psychiatric:        Mood and Affect: Mood normal.        Behavior: Behavior normal.        Thought Content: Thought content normal.        Judgment: Judgment normal.          Assessment & Plan:   Problem List Items Addressed This Visit   None Visit Diagnoses       Vaginal discharge    -  Primary   Relevant  Orders   CBC with Differential/Platelet   Comprehensive metabolic panel with GFR   TSH   Follicle stimulating hormone   Prolactin   Estradiol    Testosterone    Urinalysis w microscopic + reflex cultur   Cervicovaginal ancillary only   US  PELVIC COMPLETE WITH TRANSVAGINAL   Ambulatory referral to Obstetrics / Gynecology   HIV antibody (with reflex)     Vaginal bleeding       Relevant Orders   CBC with Differential/Platelet   Comprehensive metabolic panel with GFR   TSH   Follicle stimulating hormone   Prolactin   Estradiol    Testosterone    Urinalysis w microscopic + reflex cultur   Cervicovaginal ancillary only   US  PELVIC COMPLETE WITH TRANSVAGINAL   Ambulatory referral to Obstetrics / Gynecology   HIV antibody (with reflex)     Abnormal pelvic exam       Relevant Orders   CBC with Differential/Platelet   Comprehensive metabolic panel with GFR   TSH   Follicle stimulating hormone   Prolactin   Estradiol    Testosterone    Urinalysis w microscopic + reflex cultur   Cervicovaginal ancillary only   US  PELVIC COMPLETE WITH TRANSVAGINAL   Ambulatory referral to Obstetrics / Gynecology   HIV antibody (with reflex)       Assessment & Plan Abnormal uterine and vaginal bleeding with vaginal discharge Intermittent bleeding with clots and dark discharge. Incomplete pelvic exam due to large amount of thin/watery, brownish fluid filling the vaginal vault and speculum. Manual exam deferred.  - Attempted vaginal swab for STD Screening. Starting with labs, US , and changing GYN referral to urgent given abnormal findings.   Patient aware of signs/symptoms requiring further/urgent evaluation.      Follow-up: Return for - pending results or sooner if needed.   Waddell FURY Almarie, DNP, FNP-C  I,Emily Lagle,acting as a neurosurgeon for Waddell KATHEE Almarie, NP.,have documented all relevant documentation on the behalf of Waddell KATHEE Almarie, NP.   I, Waddell KATHEE Almarie, NP, have reviewed all documentation  for this visit. The documentation on 03/24/2024 for the exam, diagnosis, procedures, and orders are all accurate and complete. "

## 2024-03-25 ENCOUNTER — Ambulatory Visit: Payer: Self-pay | Admitting: Family Medicine

## 2024-03-25 ENCOUNTER — Telehealth: Payer: Self-pay | Admitting: Family Medicine

## 2024-03-25 DIAGNOSIS — A599 Trichomoniasis, unspecified: Secondary | ICD-10-CM

## 2024-03-25 LAB — CERVICOVAGINAL ANCILLARY ONLY
Bacterial Vaginitis (gardnerella): NEGATIVE
Candida Glabrata: NEGATIVE
Candida Vaginitis: NEGATIVE
Chlamydia: NEGATIVE
Comment: NEGATIVE
Comment: NEGATIVE
Comment: NEGATIVE
Comment: NEGATIVE
Comment: NEGATIVE
Comment: NORMAL
Neisseria Gonorrhea: NEGATIVE
Trichomonas: POSITIVE — AB

## 2024-03-25 MED ORDER — METRONIDAZOLE 500 MG PO TABS: 500.0000 mg | ORAL_TABLET | Freq: Two times a day (BID) | ORAL | 0 refills | Status: AC

## 2024-03-25 NOTE — Telephone Encounter (Signed)
 Yes, see if Cheryl Wiggins can run her insurance to find out where would be cheaper - [facility], [facility], etc?

## 2024-03-25 NOTE — Telephone Encounter (Signed)
 Copied from CRM [id number]. Topic: Clinical - Request for Lab/Test Order >> Mar 25, 2024 11:28 AM Porter L wrote: Reason for CRM: patient called in and stated she wants to have her imagining order sent to a outside location she's getting charged hospital billing price and its over $600, asking for a callback at [phone]

## 2024-03-25 NOTE — Telephone Encounter (Signed)
Changed to [facility].

## 2024-03-25 NOTE — Telephone Encounter (Signed)
Can we change location?

## 2024-03-26 ENCOUNTER — Ambulatory Visit (HOSPITAL_BASED_OUTPATIENT_CLINIC_OR_DEPARTMENT_OTHER)

## 2024-03-26 LAB — URINALYSIS W MICROSCOPIC + REFLEX CULTURE
Bacteria, UA: NONE SEEN /HPF
Bilirubin Urine: NEGATIVE
Glucose, UA: NEGATIVE
Nitrites, Initial: NEGATIVE
Specific Gravity, Urine: 1.014 (ref 1.001–1.035)
pH: 5.5 (ref 5.0–8.0)

## 2024-03-26 LAB — URINE CULTURE
MICRO NUMBER:: 17473420
Result:: NO GROWTH
SPECIMEN QUALITY:: ADEQUATE

## 2024-03-26 LAB — CULTURE INDICATED

## 2024-03-28 NOTE — Telephone Encounter (Signed)
 Should be good to go to GI now. Thanks.

## 2024-03-28 NOTE — Addendum Note (Signed)
 Addended by: Jonique Kulig L on: 03/28/2024 02:13 PM   Modules accepted: Orders

## 2024-03-30 ENCOUNTER — Ambulatory Visit (HOSPITAL_BASED_OUTPATIENT_CLINIC_OR_DEPARTMENT_OTHER)

## 2024-04-01 ENCOUNTER — Ambulatory Visit

## 2024-04-04 ENCOUNTER — Ambulatory Visit (HOSPITAL_BASED_OUTPATIENT_CLINIC_OR_DEPARTMENT_OTHER)
